# Patient Record
Sex: Female | Born: 1985 | Race: Black or African American | Hispanic: No | Marital: Single | Smoking: Never smoker
Health system: Southern US, Community
[De-identification: ages and names within clinical notes are randomized; demographics above are authoritative.]

## PROBLEM LIST (undated history)

## (undated) ENCOUNTER — Inpatient Hospital Stay (HOSPITAL_COMMUNITY): Payer: Self-pay

## (undated) DIAGNOSIS — Z8619 Personal history of other infectious and parasitic diseases: Secondary | ICD-10-CM

## (undated) DIAGNOSIS — O10919 Unspecified pre-existing hypertension complicating pregnancy, unspecified trimester: Secondary | ICD-10-CM

## (undated) DIAGNOSIS — E669 Obesity, unspecified: Secondary | ICD-10-CM

## (undated) DIAGNOSIS — I10 Essential (primary) hypertension: Secondary | ICD-10-CM

## (undated) DIAGNOSIS — Z36 Encounter for antenatal screening of mother: Secondary | ICD-10-CM

## (undated) DIAGNOSIS — M419 Scoliosis, unspecified: Secondary | ICD-10-CM

## (undated) HISTORY — PX: WISDOM TOOTH EXTRACTION: SHX21

## (undated) HISTORY — DX: Obesity, unspecified: E66.9

## (undated) HISTORY — PX: SPINAL GROWTH RODS: SHX2427

## (undated) HISTORY — DX: Essential (primary) hypertension: I10

## (undated) HISTORY — DX: Encounter for antenatal screening of mother: Z36

## (undated) HISTORY — DX: Personal history of other infectious and parasitic diseases: Z86.19

---

## 2007-04-12 ENCOUNTER — Emergency Department (HOSPITAL_COMMUNITY): Admission: EM | Admit: 2007-04-12 | Discharge: 2007-04-12 | Payer: Self-pay | Admitting: Emergency Medicine

## 2007-10-28 ENCOUNTER — Other Ambulatory Visit: Admission: RE | Admit: 2007-10-28 | Discharge: 2007-10-28 | Payer: Self-pay | Admitting: Family Medicine
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
## 2009-05-22 ENCOUNTER — Other Ambulatory Visit: Admission: RE | Admit: 2009-05-22 | Discharge: 2009-05-22 | Payer: Self-pay | Admitting: *Deleted

## 2009-08-01 ENCOUNTER — Inpatient Hospital Stay (HOSPITAL_COMMUNITY): Admission: AD | Admit: 2009-08-01 | Discharge: 2009-08-01 | Payer: Self-pay | Admitting: Family Medicine

## 2009-08-03 ENCOUNTER — Inpatient Hospital Stay (HOSPITAL_COMMUNITY): Admission: AD | Admit: 2009-08-03 | Discharge: 2009-08-03 | Payer: Self-pay | Admitting: Obstetrics and Gynecology

## 2010-01-19 ENCOUNTER — Emergency Department (HOSPITAL_COMMUNITY)
Admission: EM | Admit: 2010-01-19 | Discharge: 2010-01-19 | Payer: Self-pay | Source: Home / Self Care | Admitting: Emergency Medicine

## 2010-06-25 ENCOUNTER — Other Ambulatory Visit
Admission: RE | Admit: 2010-06-25 | Discharge: 2010-06-25 | Payer: Self-pay | Source: Home / Self Care | Admitting: Internal Medicine

## 2010-06-25 ENCOUNTER — Other Ambulatory Visit: Payer: Self-pay | Admitting: Internal Medicine

## 2010-08-29 LAB — URINALYSIS, ROUTINE W REFLEX MICROSCOPIC
Bilirubin Urine: NEGATIVE
Glucose, UA: NEGATIVE mg/dL
Ketones, ur: NEGATIVE mg/dL
Leukocytes, UA: NEGATIVE
Nitrite: NEGATIVE
Protein, ur: NEGATIVE mg/dL
Specific Gravity, Urine: 1.02 (ref 1.005–1.030)
Urobilinogen, UA: 0.2 mg/dL (ref 0.0–1.0)
pH: 7 (ref 5.0–8.0)

## 2010-08-29 LAB — WET PREP, GENITAL: Trich, Wet Prep: NONE SEEN

## 2010-08-29 LAB — POCT PREGNANCY, URINE: Preg Test, Ur: NEGATIVE

## 2010-08-29 LAB — CBC
HCT: 36.2 % (ref 36.0–46.0)
Hemoglobin: 11.7 g/dL — ABNORMAL LOW (ref 12.0–15.0)
Hemoglobin: 11.8 g/dL — ABNORMAL LOW (ref 12.0–15.0)
MCV: 88 fL (ref 78.0–100.0)
MCV: 88.7 fL (ref 78.0–100.0)
RBC: 4.13 MIL/uL (ref 3.87–5.11)
WBC: 10.2 10*3/uL (ref 4.0–10.5)
WBC: 7.2 10*3/uL (ref 4.0–10.5)

## 2010-08-29 LAB — URINE MICROSCOPIC-ADD ON

## 2011-06-06 LAB — OB RESULTS CONSOLE RUBELLA ANTIBODY, IGM: Rubella: IMMUNE

## 2011-06-06 LAB — OB RESULTS CONSOLE GC/CHLAMYDIA
Chlamydia: NEGATIVE
Gonorrhea: NEGATIVE

## 2011-06-06 LAB — OB RESULTS CONSOLE HEPATITIS B SURFACE ANTIGEN: Hepatitis B Surface Ag: NEGATIVE

## 2011-06-06 LAB — OB RESULTS CONSOLE RPR: RPR: NONREACTIVE

## 2011-06-09 NOTE — L&D Delivery Note (Signed)
Delivery Note At 2:52 AM [name] viable and healthy female was delivered via Vaginal, Spontaneous Delivery (Presentation: ; Occiput Anterior).  APGAR: 7, 9; weight .   Placenta status: Intact, Spontaneous.  Not sent Cord: 3 vessels with the following complications: None.  Cord pH: none  Anesthesia: Epidural Local lidocaine Episiotomy: none Lacerations: 2nd degree;Sulcus;Vaginal;Perineal Suture Repair: 3.0 chromic Est. Blood Loss (mL): 300  Mom to postpartum.  Baby to nursery-stable.  Diamond [name] 12/23/2011, 3:17 AM

## 2011-08-31 IMAGING — US US TRANSVAGINAL NON-OB
1 series · 14 of 25 positions shown · non-contrast
Comparison: None.

CLINICAL DATA: Menorrhagia.

TRANSABDOMINAL AND TRANSVAGINAL ULTRASOUND OF PELVIS
TECHNIQUE: Both transabdominal and transvaginal ultrasound
examinations of the pelvis were performed including evaluation of
the uterus, ovaries, adnexal regions, and pelvic cul-de-sac.

[Series 1: us transvaginal non-ob · 0.20mm/px · 14 of 40 slices shown]
[im 1/40]
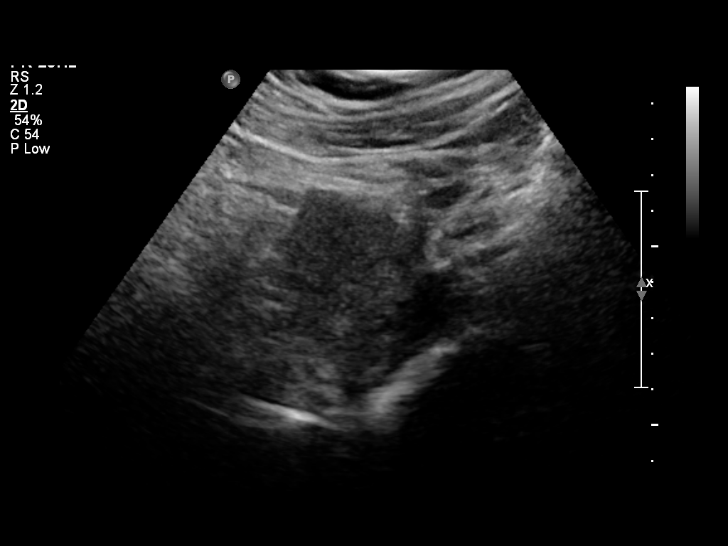
[im 4/40]
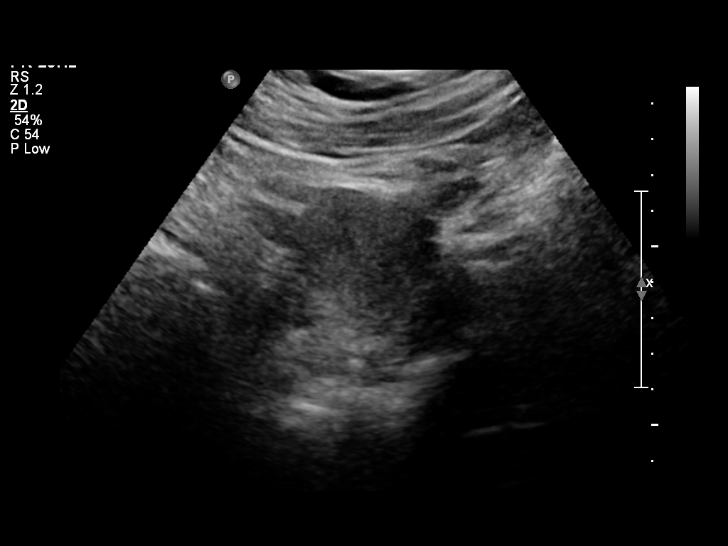
[im 7/40]
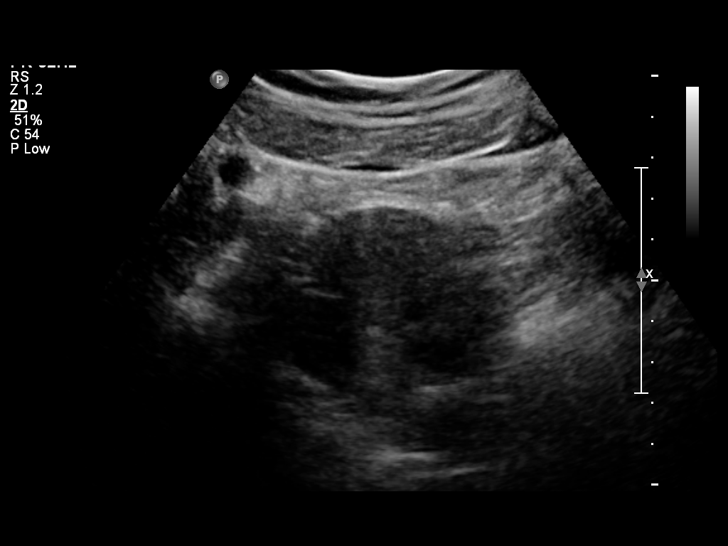
[im 10/40]
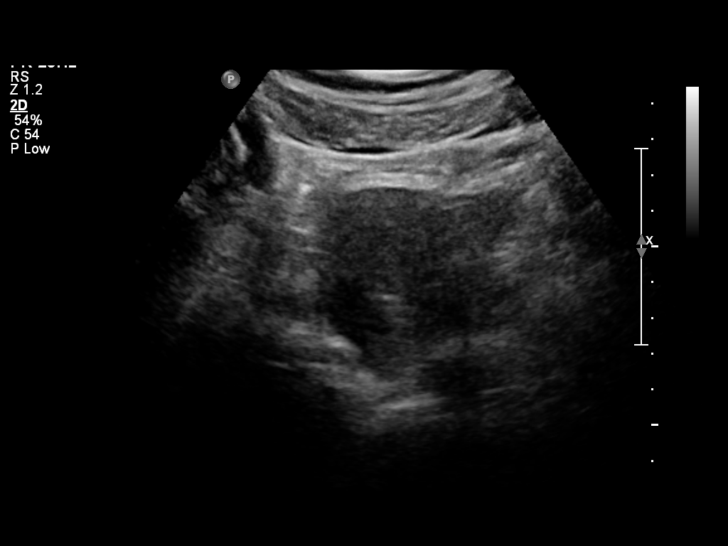
[im 14/40]
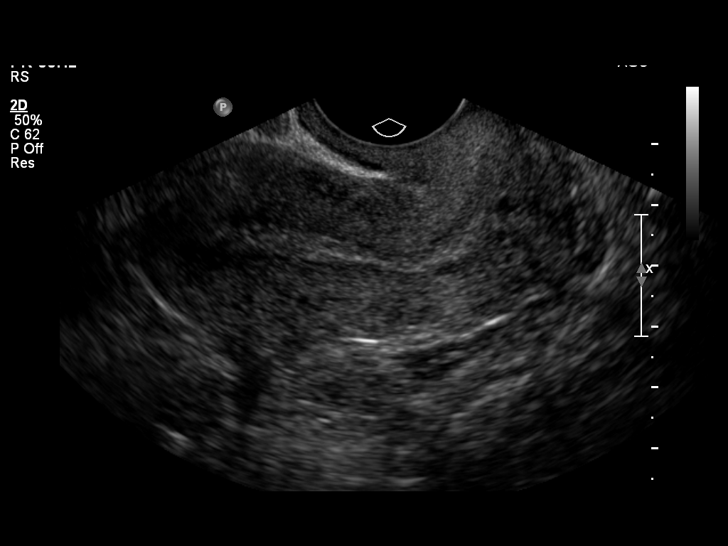
[im 15/40]
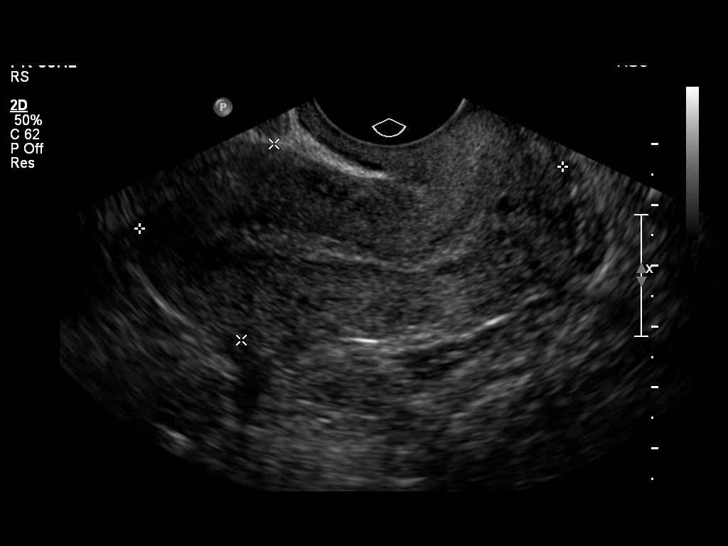
[im 18/40]
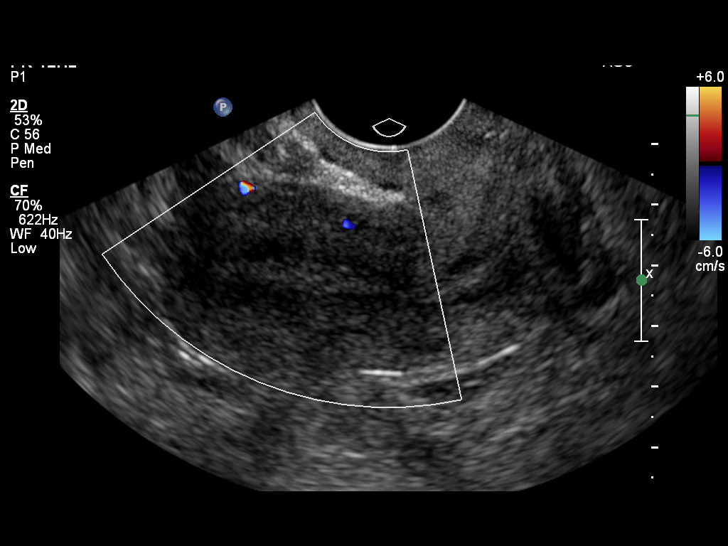
[im 22/40]
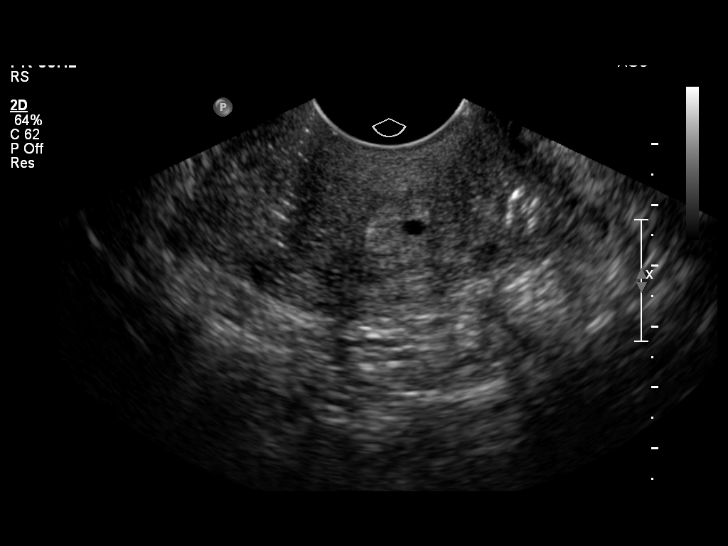
[im 25/40]
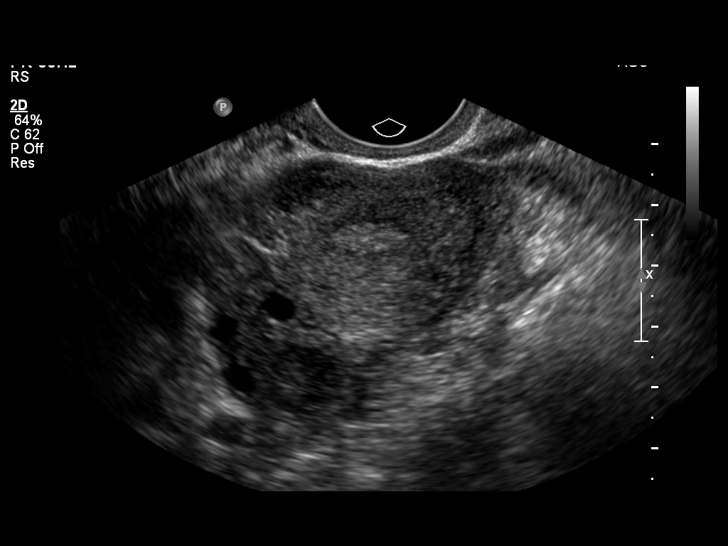
[im 27/40]
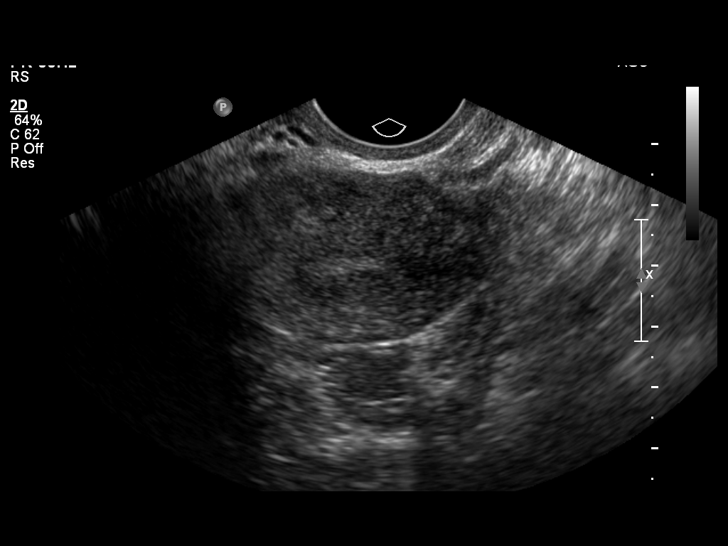
[im 30/40]
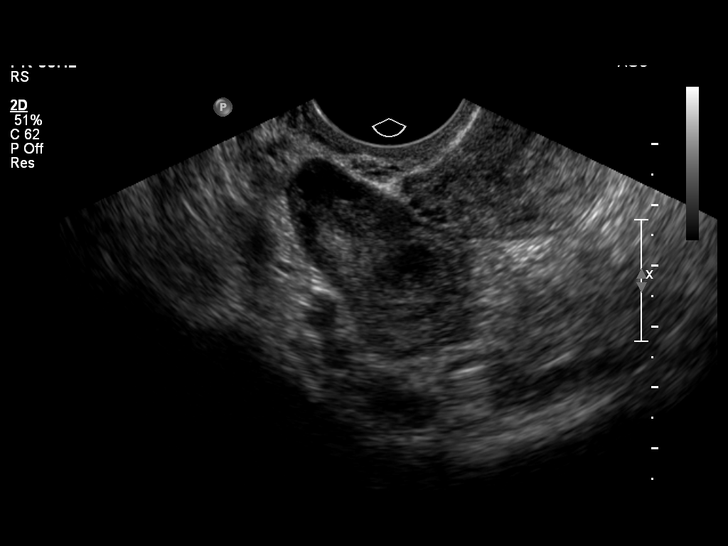
[im 33/40]
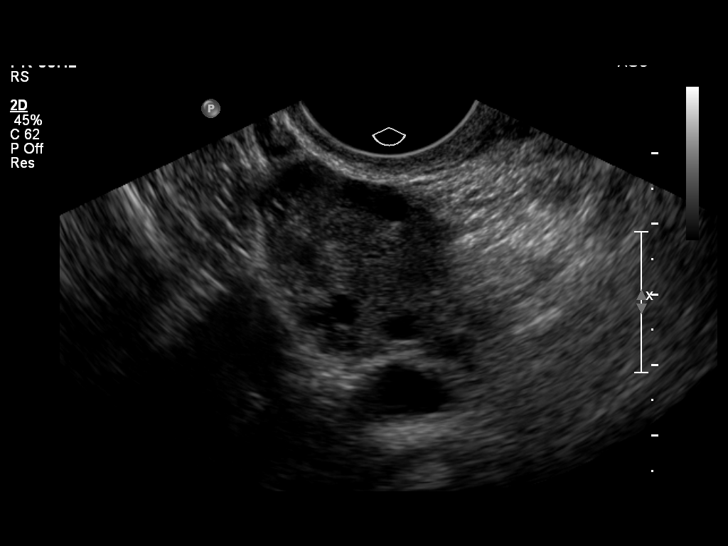
[im 36/40]
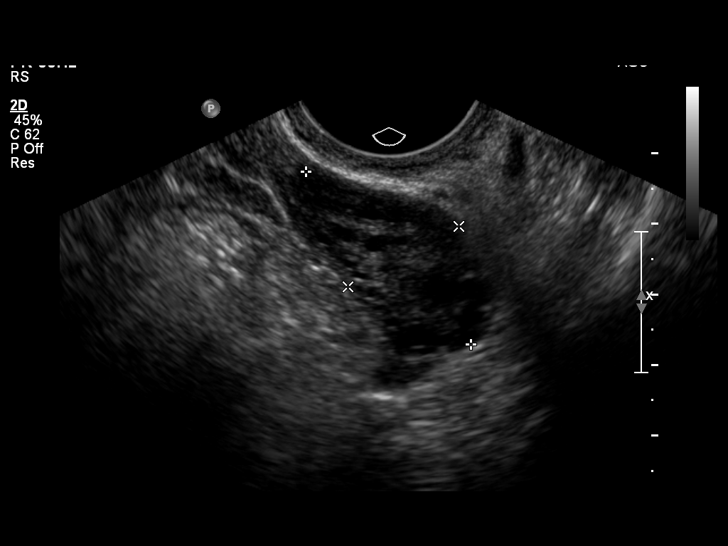
[im 40/40]
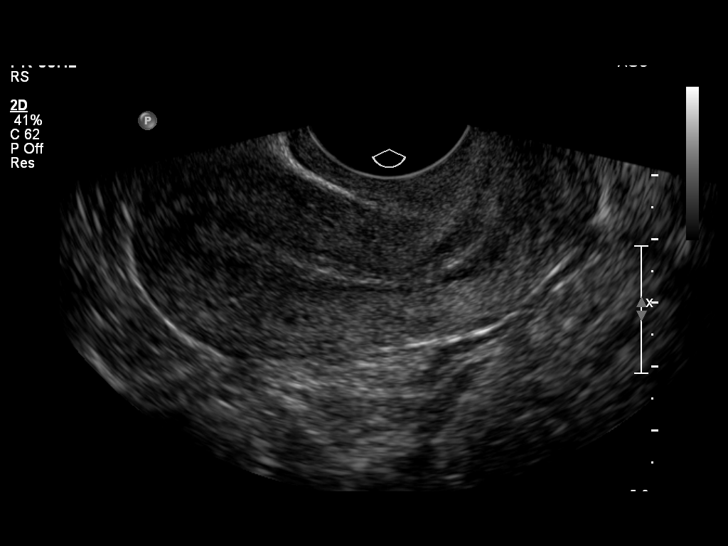

[14 of 25 positions shown; findings below may reference images not displayed]

FINDINGS: Uterus measures approximately 7.0 x 3.3 x 4.7 cm.  No evidence of
uterine mass.

Endometrium is homogeneous and measures approximately 5 mm in
thickness.

Right Ovary measures 3.7 x 2.1 x 2.5 cm.  Normal sonographic
appearance of the ovary.

Left Ovary measures 3.4 x 1.8 x 2.3 cm.  Normal sonographic
appearance.

Other Findings:  No free fluid identified in the pelvis.
IMPRESSION: Normal pelvic ultrasound.

## 2011-09-10 ENCOUNTER — Encounter (HOSPITAL_COMMUNITY): Payer: Self-pay | Admitting: *Deleted

## 2011-09-10 ENCOUNTER — Inpatient Hospital Stay (HOSPITAL_COMMUNITY)
Admission: AD | Admit: 2011-09-10 | Discharge: 2011-09-10 | Disposition: A | Discharge status: Home / Self Care | Payer: 59 | Attending: Obstetrics and Gynecology | Admitting: Obstetrics and Gynecology

## 2011-09-10 DIAGNOSIS — R197 Diarrhea, unspecified: Secondary | ICD-10-CM

## 2011-09-10 DIAGNOSIS — O2390 Unspecified genitourinary tract infection in pregnancy, unspecified trimester: Secondary | ICD-10-CM | POA: Insufficient documentation

## 2011-09-10 DIAGNOSIS — B3731 Acute candidiasis of vulva and vagina: Secondary | ICD-10-CM | POA: Insufficient documentation

## 2011-09-10 DIAGNOSIS — B373 Candidiasis of vulva and vagina: Secondary | ICD-10-CM | POA: Insufficient documentation

## 2011-09-10 HISTORY — DX: Scoliosis, unspecified: M41.9

## 2011-09-10 LAB — URINALYSIS, ROUTINE W REFLEX MICROSCOPIC
Bilirubin Urine: NEGATIVE
Hgb urine dipstick: NEGATIVE
Specific Gravity, Urine: 1.025 (ref 1.005–1.030)
pH: 6 (ref 5.0–8.0)

## 2011-09-10 NOTE — MAU Note (Signed)
Pt states 1 instance of diarrhea since 0700, had h/a, no abd pain or diarrhea.

## 2011-09-10 NOTE — MAU Note (Signed)
Pt reports abd pain x 3 hours and diarrhea x 2. Denies bleeding or ROM

## 2011-09-10 NOTE — MAU Provider Note (Signed)
History   26 yo G1P0 BF now @ [redacted] wk gestation presents for evaluation of diarrhea x 2 since 5: 30 am. Pt had abd cramping. No fever. (+) FM no other family member ill Had eaten taco. Pt c/o itchy rash b/w breast  Chief Complaint  Patient presents with  . Abdominal Pain  diarrhea   OB History    Grav Para Term Preterm Abortions TAB SAB Ect Mult Living   1               Past Medical History  Diagnosis Date  . Scoliosis     Past Surgical History  Procedure Date  . Spinal growth rods     Family History  Problem Relation Age of Onset  . Hypertension Mother     History  Substance Use Topics  . Smoking status: Never Smoker   . Smokeless tobacco: Not on file  . Alcohol Use: No    Allergies: No Known Allergies  Prescriptions prior to admission  Medication Sig Dispense Refill  . acetaminophen (TYLENOL) 500 MG tablet Take 500 mg by mouth every 6 (six) hours as needed. For headache      . OVER THE COUNTER MEDICATION Take 1 tablet by mouth daily as needed. Airborne effervescent tablet- dissolve in water and drink as needed for cold symptoms      . Prenatal Vit-Fe Fumarate-FA (PRENATAL MULTIVITAMIN) TABS Take 1 tablet by mouth daily.         Physical Exam   Blood pressure 138/76, pulse 86, temperature 98.4 F (36.9 C), temperature source Oral, resp. rate 20, height 5\' 8"  (1.727 m), weight 311 lb (141.069 kg).  General appearance: alert, cooperative and no distress Lungs: clear to auscultation bilaterally Heart: regular rate and rhythm, S1, S2 normal, no murmur, click, rub or gallop Abdomen: gravid soft nontender Pelvic: l/c/oop Extremities: no edema, redness or tenderness in the calves or thighs ED Course  Diarrhea ? Poss GI flu IUP @ 24 week  3) candida infection P) d/c home, keep OB appt in am. mycolog ointment MDM  Tarrance Januszewski A, MD 8:15 AM 09/10/2011

## 2011-09-10 NOTE — Discharge Instructions (Signed)
Diarrhea Infections caused by germs (bacterial) or a virus commonly cause diarrhea. Your caregiver has determined that with time, rest and fluids, the diarrhea should improve. In general, eat normally while drinking more water than usual. Although water may prevent dehydration, it does not contain salt and minerals (electrolytes). Broths, weak tea without caffeine and oral rehydration solutions (ORS) replace fluids and electrolytes. Small amounts of fluids should be taken frequently. Large amounts at one time may not be tolerated. Plain water may be harmful in infants and the elderly. Oral rehydrating solutions (ORS) are available at pharmacies and grocery stores. ORS replace water and important electrolytes in proper proportions. Sports drinks are not as effective as ORS and may be harmful due to sugars worsening diarrhea.  ORS is especially recommended for use in children with diarrhea. As a general guideline for children, replace any new fluid losses from diarrhea and/or vomiting with ORS as follows:   If your child weighs 22 pounds or under (10 kg or less), give 60-120 mL ( -  cup or 2 - 4 ounces) of ORS for each episode of diarrheal stool or vomiting episode.   If your child weighs more than 22 pounds (more than 10 kgs), give 120-240 mL ( - 1 cup or 4 - 8 ounces) of ORS for each diarrheal stool or episode of vomiting.   While correcting for dehydration, children should eat normally. However, foods high in sugar should be avoided because this may worsen diarrhea. Large amounts of carbonated soft drinks, juice, gelatin desserts and other highly sugared drinks should be avoided.   After correction of dehydration, other liquids that are appealing to the child may be added. Children should drink small amounts of fluids frequently and fluids should be increased as tolerated. Children should drink enough fluids to keep urine clear or pale yellow.   Adults should eat normally while drinking more fluids  than usual. Drink small amounts of fluids frequently and increase as tolerated. Drink enough fluids to keep urine clear or pale yellow. Broths, weak decaffeinated tea, lemon lime soft drinks (allowed to go flat) and ORS replace fluids and electrolytes.   Avoid:   Carbonated drinks.   Juice.   Extremely hot or cold fluids.   Caffeine drinks.   Fatty, greasy foods.   Alcohol.   Tobacco.   Too much intake of anything at one time.   Gelatin desserts.   Probiotics are active cultures of beneficial bacteria. They may lessen the amount and number of diarrheal stools in adults. Probiotics can be found in yogurt with active cultures and in supplements.   Wash hands well to avoid spreading bacteria and virus.   Anti-diarrheal medications are not recommended for infants and children.   Only take over-the-counter or prescription medicines for pain, discomfort or fever as directed by your caregiver. Do not give aspirin to children because it may cause Reye's Syndrome.   For adults, ask your caregiver if you should continue all prescribed and over-the-counter medicines.   If your caregiver has given you a follow-up appointment, it is very important to keep that appointment. Not keeping the appointment could result in a chronic or permanent injury, and disability. If there is any problem keeping the appointment, you must call back to this facility for assistance.  SEEK IMMEDIATE MEDICAL CARE IF:   You or your child is unable to keep fluids down or other symptoms or problems become worse in spite of treatment.   Vomiting or diarrhea develops and becomes persistent.     There is vomiting of blood or bile (green material).   There is blood in the stool or the stools are black and tarry.   There is no urine output in 6-8 hours or there is only a small amount of very dark urine.   Abdominal pain develops, increases or localizes.   You have a fever.   Your baby is older than 3 months with a  rectal temperature of 102 F (38.9 C) or higher.   Your baby is 66 months old or younger with a rectal temperature of 100.4 F (38 C) or higher.   You or your child develops excessive weakness, dizziness, fainting or extreme thirst.   You or your child develops a rash, stiff neck, severe headache or become irritable or sleepy and difficult to awaken.  MAKE SURE YOU:   Understand these instructions.   Will watch your condition.   Will get help right away if you are not doing well or get worse.  Document Released: 05/15/2002 Document Revised: 05/14/2011 Document Reviewed: 04/01/2009 [facility] Patient Information 2012 [place], [place].Diarrhea Infections caused by germs (bacterial) or a virus commonly cause diarrhea. Your caregiver has determined that with time, rest and fluids, the diarrhea should improve. In general, eat normally while drinking more water than usual. Although water may prevent dehydration, it does not contain salt and minerals (electrolytes). Broths, weak tea without caffeine and oral rehydration solutions (ORS) replace fluids and electrolytes. Small amounts of fluids should be taken frequently. Large amounts at one time may not be tolerated. Plain water may be harmful in infants and the elderly. Oral rehydrating solutions (ORS) are available at pharmacies and grocery stores. ORS replace water and important electrolytes in proper proportions. Sports drinks are not as effective as ORS and may be harmful due to sugars worsening diarrhea.  ORS is especially recommended for use in children with diarrhea. As a general guideline for children, replace any new fluid losses from diarrhea and/or vomiting with ORS as follows:   If your child weighs 22 pounds or under (10 kg or less), give 60-120 mL ( -  cup or 2 - 4 ounces) of ORS for each episode of diarrheal stool or vomiting episode.   If your child weighs more than 22 pounds (more than 10 kgs), give 120-240 mL ( - 1 cup or 4 - 8  ounces) of ORS for each diarrheal stool or episode of vomiting.   While correcting for dehydration, children should eat normally. However, foods high in sugar should be avoided because this may worsen diarrhea. Large amounts of carbonated soft drinks, juice, gelatin desserts and other highly sugared drinks should be avoided.   After correction of dehydration, other liquids that are appealing to the child may be added. Children should drink small amounts of fluids frequently and fluids should be increased as tolerated. Children should drink enough fluids to keep urine clear or pale yellow.   Adults should eat normally while drinking more fluids than usual. Drink small amounts of fluids frequently and increase as tolerated. Drink enough fluids to keep urine clear or pale yellow. Broths, weak decaffeinated tea, lemon lime soft drinks (allowed to go flat) and ORS replace fluids and electrolytes.   Avoid:   Carbonated drinks.   Juice.   Extremely hot or cold fluids.   Caffeine drinks.   Fatty, greasy foods.   Alcohol.   Tobacco.   Too much intake of anything at one time.   Gelatin desserts.   Probiotics are active cultures  of beneficial bacteria. They may lessen the amount and number of diarrheal stools in adults. Probiotics can be found in yogurt with active cultures and in supplements.   Wash hands well to avoid spreading bacteria and virus.   Anti-diarrheal medications are not recommended for infants and children.   Only take over-the-counter or prescription medicines for pain, discomfort or fever as directed by your caregiver. Do not give aspirin to children because it may cause Reye's Syndrome.   For adults, ask your caregiver if you should continue all prescribed and over-the-counter medicines.   If your caregiver has given you a follow-up appointment, it is very important to keep that appointment. Not keeping the appointment could result in a chronic or permanent injury, and  disability. If there is any problem keeping the appointment, you must call back to this facility for assistance.  SEEK IMMEDIATE MEDICAL CARE IF:   You or your child is unable to keep fluids down or other symptoms or problems become worse in spite of treatment.   Vomiting or diarrhea develops and becomes persistent.   There is vomiting of blood or bile (green material).   There is blood in the stool or the stools are black and tarry.   There is no urine output in 6-8 hours or there is only a small amount of very dark urine.   Abdominal pain develops, increases or localizes.   You have a fever.   Your baby is older than 3 months with a rectal temperature of 102 F (38.9 C) or higher.   Your baby is 54 months old or younger with a rectal temperature of 100.4 F (38 C) or higher.   You or your child develops excessive weakness, dizziness, fainting or extreme thirst.   You or your child develops a rash, stiff neck, severe headache or become irritable or sleepy and difficult to awaken.  MAKE SURE YOU:   Understand these instructions.   Will watch your condition.   Will get help right away if you are not doing well or get worse.  Document Released: 05/15/2002 Document Revised: 05/14/2011 Document Reviewed: 04/01/2009 [facility] Patient Information 2012 [place], [place].Diet for Diarrhea, Adult Having frequent, runny stools (diarrhea) has many causes. Diarrhea may be caused or worsened by food or drink. Diarrhea may be relieved by changing your diet. IF YOU ARE NOT TOLERATING SOLID FOODS:  Drink enough water and fluids to keep your urine clear or pale yellow.   Avoid sugary drinks and sodas as well as milk-based beverages.   Avoid beverages containing caffeine and alcohol.   You may try rehydrating beverages. You can make your own by following this recipe:    tsp table salt.    tsp baking soda.   ? tsp salt substitute (potassium chloride).   1 tbs + 1 tsp sugar.   1 qt  water.  As your stools become more solid, you can start eating solid foods. Add foods one at a time. If a certain food causes your diarrhea to get worse, avoid that food and try other foods. A low fiber, low-fat, and lactose-free diet is recommended. Small, frequent meals may be better tolerated.  Starches  Allowed:  White, [place], and pita breads, plain rolls, buns, bagels. Plain muffins, matzo. Soda, saltine, or graham crackers. Pretzels, melba toast, zwieback. Cooked cereals made with water: cornmeal, farina, cream cereals. Dry cereals: refined corn, wheat, rice. Potatoes prepared any way without skins, refined macaroni, spaghetti, noodles, refined rice.   Avoid:  Bread, rolls,  or crackers made with whole wheat, multi-grains, rye, bran seeds, nuts, or coconut. Corn tortillas or taco shells. Cereals containing whole grains, multi-grains, bran, coconut, nuts, or raisins. Cooked or dry oatmeal. Coarse wheat cereals, granola. Cereals advertised as "high-fiber." Potato skins. Whole grain pasta, wild or brown rice. Popcorn. Sweet potatoes/yams. Sweet rolls, doughnuts, waffles, pancakes, sweet breads.  Vegetables  Allowed: Strained tomato and vegetable juices. Most well-cooked and canned vegetables without seeds. Fresh: Tender lettuce, cucumber without the skin, cabbage, spinach, bean sprouts.   Avoid: Fresh, cooked, or canned: Artichokes, baked beans, beet greens, broccoli, Brussels sprouts, corn, kale, legumes, peas, sweet potatoes. Cooked: Green or red cabbage, spinach. Avoid large servings of any vegetables, because vegetables shrink when cooked, and they contain more fiber per serving than fresh vegetables.  Fruit  Allowed: All fruit juices except prune juice. Cooked or canned: Apricots, applesauce, cantaloupe, cherries, fruit cocktail, grapefruit, grapes, kiwi, mandarin oranges, peaches, pears, plums, watermelon. Fresh: Apples without skin, ripe banana, grapes, cantaloupe, cherries, grapefruit,  peaches, oranges, plums. Keep servings limited to  cup or 1 piece.   Avoid: Fresh: Apple with skin, apricots, mango, pears, raspberries, strawberries. Prune juice, stewed or dried prunes. Dried fruits, raisins, dates. Large servings of all fresh fruits.  Meat and Meat Substitutes  Allowed: Ground or well-cooked tender beef, ham, veal, lamb, pork, or poultry. Eggs, plain cheese. Fish, oysters, shrimp, lobster, other seafoods. Liver, organ meats.   Avoid: Tough, fibrous meats with gristle. Peanut butter, smooth or chunky. Cheese, nuts, seeds, legumes, dried peas, beans, lentils.  Milk  Allowed: Yogurt, lactose-free milk, kefir, drinkable yogurt, buttermilk, soy milk.   Avoid: Milk, chocolate milk, beverages made with milk, such as milk shakes.  Soups  Allowed: Bouillon, broth, or soups made from allowed foods. Any strained soup.   Avoid: Soups made from vegetables that are not allowed, cream or milk-based soups.  Desserts and Sweets  Allowed: Sugar-free gelatin, sugar-free frozen ice pops made without sugar alcohol.   Avoid: Plain cakes and cookies, pie made with allowed fruit, pudding, custard, cream pie. Gelatin, fruit, ice, sherbet, frozen ice pops. Ice cream, ice milk without nuts. Plain hard candy, honey, jelly, molasses, syrup, sugar, chocolate syrup, gumdrops, marshmallows.  Fats and Oils  Allowed: Avoid any fats and oils.   Avoid: Seeds, nuts, olives, avocados. Margarine, butter, cream, mayonnaise, salad oils, plain salad dressings made from allowed foods. Plain gravy, crisp bacon without rind.  Beverages  Allowed: Water, decaffeinated teas, oral rehydration solutions, sugar-free beverages.   Avoid: Fruit juices, caffeinated beverages (coffee, tea, soda or pop), alcohol, sports drinks, or lemon-lime soda or pop.  Condiments  Allowed: Ketchup, mustard, horseradish, vinegar, cream sauce, cheese sauce, cocoa powder. Spices in moderation: allspice, basil, bay leaves, celery  powder or leaves, cinnamon, cumin powder, curry powder, ginger, mace, marjoram, onion or garlic powder, oregano, paprika, parsley flakes, ground pepper, rosemary, sage, savory, tarragon, thyme, turmeric.   Avoid: Coconut, honey.  Weight Monitoring: Weigh yourself every day. You should weigh yourself in the morning after you urinate and before you eat breakfast. Wear the same amount of clothing when you weigh yourself. Record your weight daily. Bring your recorded weights to your clinic visits. Tell your caregiver right away if you have gained 3 lb/1.4 kg or more in 1 day, 5 lb/2.3 kg in a week, or whatever amount you were told to report. SEEK IMMEDIATE MEDICAL CARE IF:   You are unable to keep fluids down.   You start to throw up (vomit) or  diarrhea keeps coming back (persistent).   Abdominal pain develops, increases, or can be felt in one place (localizes).   You have an oral temperature above 102 F (38.9 C), not controlled by medicine.   Diarrhea contains blood or mucus.   You develop excessive weakness, dizziness, fainting, or extreme thirst.  MAKE SURE YOU:   Understand these instructions.   Will watch your condition.   Will get help right away if you are not doing well or get worse.  Document Released: 08/15/2003 Document Revised: 05/14/2011 Document Reviewed: 12/06/2008 [facility] Patient Information 2012 [place], [place].

## 2011-12-17 ENCOUNTER — Other Ambulatory Visit: Payer: Self-pay | Admitting: Obstetrics and Gynecology

## 2011-12-21 ENCOUNTER — Encounter (HOSPITAL_COMMUNITY): Payer: Self-pay | Admitting: *Deleted

## 2011-12-21 ENCOUNTER — Telehealth (HOSPITAL_COMMUNITY): Payer: Self-pay | Admitting: *Deleted

## 2011-12-21 NOTE — Telephone Encounter (Signed)
Preadmission screen  

## 2011-12-22 ENCOUNTER — Inpatient Hospital Stay (HOSPITAL_COMMUNITY): Payer: 59 | Admitting: Anesthesiology

## 2011-12-22 ENCOUNTER — Encounter (HOSPITAL_COMMUNITY): Payer: Self-pay

## 2011-12-22 ENCOUNTER — Encounter (HOSPITAL_COMMUNITY): Payer: Self-pay | Admitting: Anesthesiology

## 2011-12-22 ENCOUNTER — Inpatient Hospital Stay (HOSPITAL_COMMUNITY)
Admission: RE | Admit: 2011-12-22 | Discharge: 2011-12-25 | DRG: 774 | Disposition: A | Discharge status: Home / Self Care | Payer: 59 | Source: Ambulatory Visit | Attending: Obstetrics and Gynecology | Admitting: Obstetrics and Gynecology

## 2011-12-22 DIAGNOSIS — O10919 Unspecified pre-existing hypertension complicating pregnancy, unspecified trimester: Secondary | ICD-10-CM

## 2011-12-22 DIAGNOSIS — O1002 Pre-existing essential hypertension complicating childbirth: Principal | ICD-10-CM | POA: Diagnosis present

## 2011-12-22 DIAGNOSIS — Z22330 Carrier of Group B streptococcus: Secondary | ICD-10-CM

## 2011-12-22 DIAGNOSIS — O99892 Other specified diseases and conditions complicating childbirth: Secondary | ICD-10-CM | POA: Diagnosis present

## 2011-12-22 HISTORY — DX: Unspecified pre-existing hypertension complicating pregnancy, unspecified trimester: O10.919

## 2011-12-22 LAB — CBC
Hemoglobin: 11.3 g/dL — ABNORMAL LOW (ref 12.0–15.0)
MCH: 27.7 pg (ref 26.0–34.0)
Platelets: 178 10*3/uL (ref 150–400)
RBC: 4.08 MIL/uL (ref 3.87–5.11)

## 2011-12-22 LAB — RPR: RPR Ser Ql: NONREACTIVE

## 2011-12-22 MED ORDER — IBUPROFEN 600 MG PO TABS
600.0000 mg | ORAL_TABLET | Freq: Four times a day (QID) | ORAL | Status: DC | PRN
  Administered 2011-12-23: 600 mg via ORAL
  Filled 2011-12-22: qty 1

## 2011-12-22 MED ORDER — LACTATED RINGERS IV SOLN: 500.0000 mL | INTRAVENOUS | Status: DC | PRN

## 2011-12-22 MED ORDER — LACTATED RINGERS IV SOLN: 500.0000 mL | Freq: Once | INTRAVENOUS | Status: DC

## 2011-12-22 MED ORDER — NALBUPHINE SYRINGE 5 MG/0.5 ML
10.0000 mg | INJECTION | INTRAMUSCULAR | Status: DC | PRN
  Administered 2011-12-23: 10 mg via INTRAVENOUS
  Filled 2011-12-22 (×2): qty 1

## 2011-12-22 MED ORDER — LACTATED RINGERS IV SOLN
INTRAVENOUS | Status: DC
  Administered 2011-12-22 (×2): via INTRAVENOUS

## 2011-12-22 MED ORDER — EPHEDRINE 5 MG/ML INJ
10.0000 mg | INTRAVENOUS | Status: DC | PRN
Start: 2011-12-22 — End: 2011-12-23

## 2011-12-22 MED ORDER — OXYTOCIN BOLUS FROM INFUSION
250.0000 mL | Freq: Once | INTRAVENOUS | Status: DC
  Filled 2011-12-22: qty 500

## 2011-12-22 MED ORDER — NALBUPHINE SYRINGE 5 MG/0.5 ML
INJECTION | INTRAMUSCULAR | Status: AC
  Filled 2011-12-22: qty 1

## 2011-12-22 MED ORDER — FENTANYL 2.5 MCG/ML BUPIVACAINE 1/10 % EPIDURAL INFUSION (WH - ANES)
14.0000 mL/h | INTRAMUSCULAR | Status: DC
  Administered 2011-12-22 – 2011-12-23 (×2): 14 mL/h via EPIDURAL
  Filled 2011-12-22 (×3): qty 60

## 2011-12-22 MED ORDER — DIPHENHYDRAMINE HCL 50 MG/ML IJ SOLN: 12.5000 mg | INTRAMUSCULAR | Status: DC | PRN

## 2011-12-22 MED ORDER — OXYCODONE-ACETAMINOPHEN 5-325 MG PO TABS
1.0000 | ORAL_TABLET | ORAL | Status: DC | PRN
  Administered 2011-12-23: 2 via ORAL
  Filled 2011-12-22: qty 2

## 2011-12-22 MED ORDER — PENICILLIN G POTASSIUM 5000000 UNITS IJ SOLR
5.0000 10*6.[IU] | Freq: Once | INTRAVENOUS | Status: AC
  Administered 2011-12-22: 5 10*6.[IU] via INTRAVENOUS
  Filled 2011-12-22: qty 5

## 2011-12-22 MED ORDER — NALBUPHINE HCL 10 MG/ML IJ SOLN
10.0000 mg | INTRAMUSCULAR | Status: DC | PRN
  Administered 2011-12-22 (×2): 10 mg via INTRAVENOUS
  Filled 2011-12-22 (×2): qty 1

## 2011-12-22 MED ORDER — OXYTOCIN 40 UNITS IN LACTATED RINGERS INFUSION - SIMPLE MED: 62.5000 mL/h | Freq: Once | INTRAVENOUS | Status: DC

## 2011-12-22 MED ORDER — EPHEDRINE 5 MG/ML INJ
10.0000 mg | INTRAVENOUS | Status: DC | PRN
  Filled 2011-12-22: qty 4

## 2011-12-22 MED ORDER — CITRIC ACID-SODIUM CITRATE 334-500 MG/5ML PO SOLN: 30.0000 mL | ORAL | Status: DC | PRN

## 2011-12-22 MED ORDER — LIDOCAINE HCL (PF) 1 % IJ SOLN
30.0000 mL | INTRAMUSCULAR | Status: DC | PRN
  Filled 2011-12-22: qty 30

## 2011-12-22 MED ORDER — ACETAMINOPHEN 325 MG PO TABS: 650.0000 mg | ORAL_TABLET | ORAL | Status: DC | PRN

## 2011-12-22 MED ORDER — LIDOCAINE HCL (PF) 1 % IJ SOLN
INTRAMUSCULAR | Status: DC | PRN
  Administered 2011-12-22 (×4): 8 mL
  Administered 2011-12-23: 30 mL

## 2011-12-22 MED ORDER — PHENYLEPHRINE 40 MCG/ML (10ML) SYRINGE FOR IV PUSH (FOR BLOOD PRESSURE SUPPORT)
80.0000 ug | PREFILLED_SYRINGE | INTRAVENOUS | Status: DC | PRN
  Filled 2011-12-22: qty 5

## 2011-12-22 MED ORDER — PENICILLIN G POTASSIUM 5000000 UNITS IJ SOLR
2.5000 10*6.[IU] | INTRAMUSCULAR | Status: DC
  Administered 2011-12-22 (×4): 2.5 10*6.[IU] via INTRAVENOUS
  Filled 2011-12-22 (×8): qty 2.5

## 2011-12-22 MED ORDER — TERBUTALINE SULFATE 1 MG/ML IJ SOLN: 0.2500 mg | Freq: Once | INTRAMUSCULAR | Status: AC | PRN

## 2011-12-22 MED ORDER — FLEET ENEMA 7-19 GM/118ML RE ENEM: 1.0000 | ENEMA | RECTAL | Status: DC | PRN

## 2011-12-22 MED ORDER — ONDANSETRON HCL 4 MG/2ML IJ SOLN: 4.0000 mg | Freq: Four times a day (QID) | INTRAMUSCULAR | Status: DC | PRN

## 2011-12-22 MED ORDER — PHENYLEPHRINE 40 MCG/ML (10ML) SYRINGE FOR IV PUSH (FOR BLOOD PRESSURE SUPPORT): 80.0000 ug | PREFILLED_SYRINGE | INTRAVENOUS | Status: DC | PRN

## 2011-12-22 MED ORDER — OXYTOCIN 40 UNITS IN LACTATED RINGERS INFUSION - SIMPLE MED
1.0000 m[IU]/min | INTRAVENOUS | Status: DC
  Administered 2011-12-22: 2 m[IU]/min via INTRAVENOUS
  Filled 2011-12-22: qty 1000

## 2011-12-22 NOTE — Progress Notes (Signed)
S: c/o pain w/ balloon insertion  O: VE 2/60-70%/-3 to left ant  Intracervical ballooon placed  Tracing: baseline 125  (+) accels   IMP: Latent phase Chronic HYN GBS cx (+)  On PCN  P) cont pitocin analgesic prn

## 2011-12-22 NOTE — H&P (Signed)
Diamond [name] is a 26 y.o. female presenting for induction 2nd to chronic hTn  History OB History    Grav Para Term Preterm Abortions TAB SAB Ect Mult Living   2 0   1 1    0     Past Medical History  Diagnosis Date  . Scoliosis   . Hypertension     no meds  . Obesity   . H/O chlamydia infection   . H/O varicella   . Other specified antenatal screening     AFP4   Past Surgical History  Procedure Date  . Spinal growth rods    Family History: family history includes Hypertension in her mother.  There is no history of Anesthesia problems. Social History:  reports that she has quit smoking. She has never used smokeless tobacco. She reports that she does not drink alcohol or use illicit drugs.   Prenatal Transfer Tool  Maternal Diabetes: No Genetic Screening: Normal Maternal Ultrasounds/Referrals: Normal Fetal Ultrasounds or other Referrals:  None Maternal Substance Abuse:  No Significant Maternal Medications:  None Significant Maternal Lab Results:  Lab values include: Group B Strep positive Other Comments:  None  ROS neg  Dilation: 2 Effacement (%): 80 Station: -2 Exam by:: lee Blood pressure 130/71, pulse 81, temperature 98 F (36.7 C), temperature source Oral, resp. rate 18, height 5\' 8"  (1.727 m), weight 141.522 kg (312 lb). Exam Physical Exam  Constitutional: She is oriented to person, place, and time. She appears well-developed and well-nourished.  HENT:  Head: Atraumatic.  Eyes: EOM are normal.  Neck: Neck supple.  Cardiovascular: Normal rate.   Respiratory: Effort normal and breath sounds normal.  GI: Soft. Bowel sounds are normal.  Neurological: She is alert and oriented to person, place, and time.  Skin: Skin is warm and dry.    Prenatal labs: ABO, Rh: O/Positive/-- (12/29 0000) Antibody: Negative (12/29 0000) Rubella: Immune (12/29 0000) RPR: Nonreactive (12/29 0000)  HBsAg: Negative (12/29 0000)  HIV: Non-reactive (11/16 0000)  GBS: Positive  (06/14 0000)   Assessment/Plan: Chronic HTn Term gestation GBS cx (+) P) admit routine labs. Pitocin. PCN IV  Vang Kraeger A 12/22/2011, 1:10 PM

## 2011-12-22 NOTE — Anesthesia Procedure Notes (Addendum)
Epidural Patient location during procedure: OB Start time: 12/22/2011 8:06 PM End time: 12/22/2011 8:10 PM Reason for block: procedure for pain  Staffing Anesthesiologist: Sandrea Hughs Performed by: anesthesiologist   Preanesthetic Checklist Completed: patient identified, site marked, surgical consent, pre-op evaluation, timeout performed, IV checked, risks and benefits discussed and monitors and equipment checked  Epidural Patient position: sitting Prep: site prepped and draped and DuraPrep Patient monitoring: continuous pulse ox and blood pressure Approach: midline Injection technique: LOR air  Needle:  Needle type: Tuohy  Needle gauge: 17 G Needle length: 9 cm Needle insertion depth: 9 cm Catheter type: closed end flexible Catheter size: 19 Gauge Catheter at skin depth: 15 cm Test dose: negative and Other  Assessment Events: blood not aspirated, injection not painful, no injection resistance, negative IV test and no paresthesia  Epidural Patient location during procedure: OB Start time: 12/22/2011 10:50 PM End time: 12/22/2011 11:00 PM Reason for block: procedure for pain  Staffing Anesthesiologist: Sandrea Hughs Performed by: anesthesiologist   Preanesthetic Checklist Completed: patient identified, site marked, surgical consent, pre-op evaluation, timeout performed, IV checked, risks and benefits discussed and monitors and equipment checked  Epidural Patient position: sitting Prep: site prepped and draped and DuraPrep Patient monitoring: continuous pulse ox and blood pressure Approach: midline Injection technique: LOR air  Needle:  Needle type: Tuohy  Needle gauge: 17 G Needle length: 9 cm Needle insertion depth: 5 cm cm Catheter type: closed end flexible Catheter size: 19 Gauge Catheter at skin depth: 10 cm Test dose: negative and Other  Assessment Events: blood not aspirated, injection not painful, no injection resistance,  negative IV test and no paresthesia

## 2011-12-22 NOTE — Progress Notes (Signed)
S:  Balloon fell out  O: Pitocin VE 4-5 /90 /-3  AROM clear AF. ISE, IUPC placed  Tracing: reactive baseline120 irreg ctx  IMP: Active phase Chronic HTN Term gestation. GBS cx (+) P)  Assess ctx. Cont pitocin. Epidural prn. Cont IV PCN

## 2011-12-22 NOTE — Anesthesia Preprocedure Evaluation (Signed)
Anesthesia Evaluation  Patient identified by MRN, date of birth, ID band Patient awake    Reviewed: Allergy & Precautions, H&P , NPO status , Patient's Chart, lab work & pertinent test results  Airway Mallampati: III TM Distance: >3 FB Neck ROM: full    Dental No notable dental hx.    Pulmonary neg pulmonary ROS,    Pulmonary exam normal       Cardiovascular     Neuro/Psych negative neurological ROS  negative psych ROS   GI/Hepatic negative GI ROS, Neg liver ROS,   Endo/Other  Morbid obesity  Renal/GU negative Renal ROS  negative genitourinary   Musculoskeletal negative musculoskeletal ROS (+)   Abdominal (+) + obese,   Peds negative pediatric ROS (+)  Hematology negative hematology ROS (+)   Anesthesia Other Findings   Reproductive/Obstetrics (+) Pregnancy                           Anesthesia Physical Anesthesia Plan  ASA: III  Anesthesia Plan: Epidural   Post-op Pain Management:    Induction:   Airway Management Planned:   Additional Equipment:   Intra-op Plan:   Post-operative Plan:   Informed Consent: I have reviewed the patients History and Physical, chart, labs and discussed the procedure including the risks, benefits and alternatives for the proposed anesthesia with the patient or authorized representative who has indicated his/her understanding and acceptance.     Plan Discussed with:   Anesthesia Plan Comments:         Anesthesia Quick Evaluation

## 2011-12-23 ENCOUNTER — Encounter (HOSPITAL_COMMUNITY): Payer: Self-pay

## 2011-12-23 DIAGNOSIS — O10919 Unspecified pre-existing hypertension complicating pregnancy, unspecified trimester: Secondary | ICD-10-CM

## 2011-12-23 HISTORY — DX: Unspecified pre-existing hypertension complicating pregnancy, unspecified trimester: O10.919

## 2011-12-23 MED ORDER — IBUPROFEN 600 MG PO TABS
600.0000 mg | ORAL_TABLET | Freq: Four times a day (QID) | ORAL | Status: DC
  Administered 2011-12-23 – 2011-12-25 (×7): 600 mg via ORAL
  Filled 2011-12-23 (×8): qty 1

## 2011-12-23 MED ORDER — FERROUS SULFATE 325 (65 FE) MG PO TABS
325.0000 mg | ORAL_TABLET | Freq: Two times a day (BID) | ORAL | Status: DC
  Administered 2011-12-23 – 2011-12-25 (×4): 325 mg via ORAL
  Filled 2011-12-23 (×4): qty 1

## 2011-12-23 MED ORDER — SENNOSIDES-DOCUSATE SODIUM 8.6-50 MG PO TABS
2.0000 | ORAL_TABLET | Freq: Every day | ORAL | Status: DC
  Administered 2011-12-23 – 2011-12-24 (×2): 2 via ORAL

## 2011-12-23 MED ORDER — WITCH HAZEL-GLYCERIN EX PADS: 1.0000 "application " | MEDICATED_PAD | CUTANEOUS | Status: DC | PRN

## 2011-12-23 MED ORDER — TETANUS-DIPHTH-ACELL PERTUSSIS 5-2.5-18.5 LF-MCG/0.5 IM SUSP
0.5000 mL | Freq: Once | INTRAMUSCULAR | Status: AC
  Administered 2011-12-23: 0.5 mL via INTRAMUSCULAR
  Filled 2011-12-23: qty 0.5

## 2011-12-23 MED ORDER — ONDANSETRON HCL 4 MG PO TABS: 4.0000 mg | ORAL_TABLET | ORAL | Status: DC | PRN

## 2011-12-23 MED ORDER — DIBUCAINE 1 % RE OINT: 1.0000 "application " | TOPICAL_OINTMENT | RECTAL | Status: DC | PRN

## 2011-12-23 MED ORDER — PRENATAL MULTIVITAMIN CH
1.0000 | ORAL_TABLET | Freq: Every day | ORAL | Status: DC
  Administered 2011-12-23 – 2011-12-24 (×2): 1 via ORAL
  Filled 2011-12-23 (×2): qty 1

## 2011-12-23 MED ORDER — DIPHENHYDRAMINE HCL 25 MG PO CAPS: 25.0000 mg | ORAL_CAPSULE | Freq: Four times a day (QID) | ORAL | Status: DC | PRN

## 2011-12-23 MED ORDER — BENZOCAINE-MENTHOL 20-0.5 % EX AERO
1.0000 "application " | INHALATION_SPRAY | CUTANEOUS | Status: DC | PRN
  Administered 2011-12-23: 1 via TOPICAL
  Filled 2011-12-23: qty 56

## 2011-12-23 MED ORDER — ONDANSETRON HCL 4 MG/2ML IJ SOLN: 4.0000 mg | INTRAMUSCULAR | Status: DC | PRN

## 2011-12-23 MED ORDER — OXYCODONE-ACETAMINOPHEN 5-325 MG PO TABS
1.0000 | ORAL_TABLET | ORAL | Status: DC | PRN
  Administered 2011-12-24: 1 via ORAL
  Filled 2011-12-23: qty 1

## 2011-12-23 MED ORDER — LANOLIN HYDROUS EX OINT: TOPICAL_OINTMENT | CUTANEOUS | Status: DC | PRN

## 2011-12-23 MED ORDER — SIMETHICONE 80 MG PO CHEW: 80.0000 mg | CHEWABLE_TABLET | ORAL | Status: DC | PRN

## 2011-12-23 MED ORDER — ZOLPIDEM TARTRATE 5 MG PO TABS: 5.0000 mg | ORAL_TABLET | Freq: Every evening | ORAL | Status: DC | PRN

## 2011-12-23 NOTE — Progress Notes (Signed)
Pain not under control with multiple attempts of epidural bolus, IV pain medication. Md aware

## 2011-12-23 NOTE — Progress Notes (Signed)
INTERVAL NOTE:  S:  Sitting in bed, BFing initiated, min cramping, (+) voids, mod bleed, denies HA/NV/dizziness  O:  VSS, AAO x 3, NAD  FF bellow U  Small lochia  A / P:   PPD #0  Stable post partum  Routine PP orders  Lima Chillemi, CNM, MSN  12/23/2011 8:59 AM

## 2011-12-23 NOTE — Progress Notes (Signed)
Dr. Cherly Hensen and Dr. Arby Barrette aware of pts pain, not controlled with epidural.

## 2011-12-23 NOTE — Anesthesia Postprocedure Evaluation (Signed)
  Anesthesia Post-op Note  Patient: Diamond [name]  Procedure(s) Performed: * No procedures listed *  Patient Location: PACU and Mother/Baby  Anesthesia Type: Epidural  Level of Consciousness: awake, alert  and oriented  Airway and Oxygen Therapy: Patient Spontanous Breathing  Post-op Pain: none  Post-op Assessment: Post-op Vital signs reviewed and Patient's Cardiovascular Status Stable  Post-op Vital Signs: Reviewed and stable  Complications: No apparent anesthesia complications

## 2011-12-23 NOTE — Progress Notes (Signed)
S: S/p epidural. Has Harrington rods. Notes still with pain  O:  BP 149 /77 baseline 150 (+) early decel Ctx ( triplets) then spaced followed by quad ctx or triplet  VE: 5 cm /90/-3 per RN  IMP: arrest of dilation due to dysfunctional labor pattern GBS cx (+) on IV PCN Chronic HTN P) cont trying to achieve pain control. Cont with increase pitocin

## 2011-12-24 LAB — CBC
HCT: 31.4 % — ABNORMAL LOW (ref 36.0–46.0)
Hemoglobin: 10.1 g/dL — ABNORMAL LOW (ref 12.0–15.0)
MCHC: 32.2 g/dL (ref 30.0–36.0)
MCV: 86 fL (ref 78.0–100.0)
RDW: 15.1 % (ref 11.5–15.5)

## 2011-12-24 NOTE — Progress Notes (Signed)
Patient ID: Diamond [name], female   DOB: 01-26-1986, 26 y.o.   MRN: [id number]  PPD 1 SVD  S:  Reports feeling well - slept well last pm x 4 hour then intermittent feeding schedule             Tolerating po/ No nausea or vomiting             Bleeding is light             Pain controlled withprescription NSAID's including motrin only             Up ad lib / ambulatory  Newborn breast feeding well  / female   O:  A & O x 3 NAD             VS: Blood pressure 115/72, pulse 79, temperature 97.6 F (36.4 C), temperature source Oral, resp. rate 18, height 5\' 8"  (1.727 m), weight 141.522 kg (312 lb), SpO2 99.00%, unknown if currently breastfeeding.  LABS: WBC/Hgb/Hct/Plts:  11.8/10.1/31.4/173 (07/18 0612)   Lungs: Clear and unlabored  Heart: regular rate and rhythm / no mumurs  Abdomen: soft, non-tender, non-distended              Fundus: firm, non-tender, U-1  Perineum: mild edema  Lochia: light  Extremities: trace edema, no calf pain or tenderness    A: PPD # 1   Doing well - stable status  P:  Routine post partum orders  Anticipate d/c in am  Marlinda Mike CNM, MSN 12/24/2011, 8:41 AM

## 2011-12-25 MED ORDER — FERROUS SULFATE 325 (65 FE) MG PO TABS: 325.0000 mg | ORAL_TABLET | Freq: Two times a day (BID) | ORAL | Status: DC

## 2011-12-25 MED ORDER — IBUPROFEN 600 MG PO TABS: 600.0000 mg | ORAL_TABLET | Freq: Four times a day (QID) | ORAL | Status: AC

## 2011-12-25 MED ORDER — DOCUSATE SODIUM 100 MG PO CAPS: 100.0000 mg | ORAL_CAPSULE | Freq: Two times a day (BID) | ORAL | Status: AC | PRN

## 2011-12-25 NOTE — Progress Notes (Addendum)
Patient ID: Diamond [name], female   DOB: 1985-10-24, 26 y.o.   MRN: [id number] PPD # 2  Subjective: Pt reports feeling well and eager for d/c home/ Pain controlled with ibuprofen Tolerating po/ Voiding without problems/ No n/v Bleeding is light/ Newborn info:  Information for the patient's newborn:  Myhand, Girl [name] [[id number]]  female Feeding: breast    Objective:  VS: Blood pressure 121/72, pulse 84, temperature 97.8 F (36.6 C), temperature source Oral, resp. rate 20.   Basename 12/24/11 0612  WBC 11.8*  HGB 10.1*  HCT 31.4*  PLT 173    Blood type: --/--/O POS, O POS (07/16 0730) Rubella: Immune (12/29 0000)    Physical Exam:  General: A & O x 3  alert, cooperative and no distress CV: Regular rate and rhythm Resp: clear Abdomen: soft, nontender, normal bowel sounds Uterine Fundus: firm, below umbilicus, nontender Perineum: not inspected and pt is dressed and infant is nursing Ext: Homans sign is negative, no sign of DVT and no edema, redness or tenderness in the calves or thighs    A/P: PPD # 2/ G2P1011/ S/P:spontaneous vaginal delivery Hx chronic HTN; no meds; stable pp Doing well and stable for discharge home RX: Ibuprofen 600mg  po Q 6 hrs prn pain #30 Refill x 1 Niferex 150mg  po QD #30 Refill x 1 WOB/GYN booklet given Routine pp visit in 6wks   Demetrius Revel, MSN, [facility] 12/25/2011, 9:41 AM

## 2011-12-25 NOTE — Discharge Summary (Signed)
Obstetric Discharge Summary Reason for Admission: induction of labor and hx chronic HTN; 39wks Prenatal Procedures: NST and ultrasound Intrapartum Procedures: spontaneous vaginal delivery Postpartum Procedures: none Complications-Operative and Postpartum: 2nd degree perineal laceration Hemoglobin  Date Value Range Status  12/24/2011 10.1* 12.0 - 15.0 g/dL Final     HCT  Date Value Range Status  12/24/2011 31.4* 36.0 - 46.0 % Final    Physical Exam:  General: alert, cooperative and no distress Lochia: appropriate Uterine Fundus: firm Incision: n/a DVT Evaluation: No evidence of DVT seen on physical exam.  Discharge Diagnoses: Term Pregnancy-delivered; Mild ABL Anemia  Discharge Information: Date: 12/25/2011 Activity: pelvic rest Diet: routine Medications: PNV, Ibuprofen, Colace and Iron Condition: stable Instructions: refer to practice specific booklet Discharge to: home   Newborn Data: Live born female on 12/23/11 Birth Weight: 8 lb 2.5 oz (3700 g) APGAR: 7, 9  Home with mother.  Hiram Mciver K 12/25/2011, 9:48 AM

## 2011-12-30 ENCOUNTER — Ambulatory Visit (HOSPITAL_COMMUNITY): Admission: RE | Admit: 2011-12-30 | Payer: 59 | Source: Ambulatory Visit

## 2011-12-31 ENCOUNTER — Ambulatory Visit (HOSPITAL_COMMUNITY)
Admission: RE | Admit: 2011-12-31 | Discharge: 2011-12-31 | Disposition: A | Discharge status: Home / Self Care | Payer: 59 | Source: Ambulatory Visit | Attending: Obstetrics and Gynecology | Admitting: Obstetrics and Gynecology

## 2011-12-31 DIAGNOSIS — M7989 Other specified soft tissue disorders: Secondary | ICD-10-CM

## 2011-12-31 DIAGNOSIS — M79606 Pain in leg, unspecified: Secondary | ICD-10-CM

## 2011-12-31 DIAGNOSIS — M79609 Pain in unspecified limb: Secondary | ICD-10-CM

## 2011-12-31 NOTE — Progress Notes (Signed)
VASCULAR LAB PRELIMINARY  PRELIMINARY  PRELIMINARY  PRELIMINARY  Right lower extremity venous duplex has been  completed.    Preliminary report: Right lower extremity is negative for deep and superficial vein thrombosis.  Report called to Dr. Cherly Hensen and she spoke with the patient.  Lawernce Earll, 12/31/2011, 11:21 AM

## 2014-04-09 ENCOUNTER — Encounter (HOSPITAL_COMMUNITY): Payer: Self-pay

## 2014-11-23 ENCOUNTER — Emergency Department (HOSPITAL_COMMUNITY)
Admission: EM | Admit: 2014-11-23 | Discharge: 2014-11-23 | Disposition: A | Discharge status: Home / Self Care | Payer: Self-pay | Attending: Emergency Medicine | Admitting: Emergency Medicine

## 2014-11-23 ENCOUNTER — Emergency Department (HOSPITAL_COMMUNITY): Payer: Self-pay

## 2014-11-23 ENCOUNTER — Encounter (HOSPITAL_COMMUNITY): Payer: Self-pay | Admitting: Emergency Medicine

## 2014-11-23 DIAGNOSIS — Z8619 Personal history of other infectious and parasitic diseases: Secondary | ICD-10-CM | POA: Insufficient documentation

## 2014-11-23 DIAGNOSIS — K219 Gastro-esophageal reflux disease without esophagitis: Secondary | ICD-10-CM | POA: Insufficient documentation

## 2014-11-23 DIAGNOSIS — R079 Chest pain, unspecified: Secondary | ICD-10-CM | POA: Insufficient documentation

## 2014-11-23 DIAGNOSIS — Z8739 Personal history of other diseases of the musculoskeletal system and connective tissue: Secondary | ICD-10-CM | POA: Insufficient documentation

## 2014-11-23 DIAGNOSIS — Z79899 Other long term (current) drug therapy: Secondary | ICD-10-CM | POA: Insufficient documentation

## 2014-11-23 DIAGNOSIS — Z3202 Encounter for pregnancy test, result negative: Secondary | ICD-10-CM | POA: Insufficient documentation

## 2014-11-23 DIAGNOSIS — Z87891 Personal history of nicotine dependence: Secondary | ICD-10-CM | POA: Insufficient documentation

## 2014-11-23 DIAGNOSIS — E669 Obesity, unspecified: Secondary | ICD-10-CM | POA: Insufficient documentation

## 2014-11-23 LAB — CBC
HCT: 37.1 % (ref 36.0–46.0)
Hemoglobin: 11.6 g/dL — ABNORMAL LOW (ref 12.0–15.0)
MCH: 26.9 pg (ref 26.0–34.0)
MCHC: 31.3 g/dL (ref 30.0–36.0)
MCV: 85.9 fL (ref 78.0–100.0)
PLATELETS: 222 10*3/uL (ref 150–400)
RBC: 4.32 MIL/uL (ref 3.87–5.11)
RDW: 14.2 % (ref 11.5–15.5)
WBC: 6.3 10*3/uL (ref 4.0–10.5)

## 2014-11-23 LAB — URINALYSIS, ROUTINE W REFLEX MICROSCOPIC
BILIRUBIN URINE: NEGATIVE
Glucose, UA: NEGATIVE mg/dL
Ketones, ur: NEGATIVE mg/dL
Leukocytes, UA: NEGATIVE
NITRITE: NEGATIVE
PROTEIN: NEGATIVE mg/dL
Specific Gravity, Urine: 1.006 (ref 1.005–1.030)
UROBILINOGEN UA: 0.2 mg/dL (ref 0.0–1.0)
pH: 6 (ref 5.0–8.0)

## 2014-11-23 LAB — BASIC METABOLIC PANEL
ANION GAP: 8 (ref 5–15)
BUN: 10 mg/dL (ref 6–20)
CHLORIDE: 106 mmol/L (ref 101–111)
CO2: 24 mmol/L (ref 22–32)
Calcium: 9.2 mg/dL (ref 8.9–10.3)
Creatinine, Ser: 0.8 mg/dL (ref 0.44–1.00)
Glucose, Bld: 104 mg/dL — ABNORMAL HIGH (ref 65–99)
POTASSIUM: 3.7 mmol/L (ref 3.5–5.1)
Sodium: 138 mmol/L (ref 135–145)

## 2014-11-23 LAB — BRAIN NATRIURETIC PEPTIDE: B NATRIURETIC PEPTIDE 5: 16.8 pg/mL (ref 0.0–100.0)

## 2014-11-23 LAB — POC URINE PREG, ED: PREG TEST UR: NEGATIVE

## 2014-11-23 LAB — I-STAT TROPONIN, ED: TROPONIN I, POC: 0 ng/mL (ref 0.00–0.08)

## 2014-11-23 LAB — URINE MICROSCOPIC-ADD ON

## 2014-11-23 MED ORDER — OMEPRAZOLE 20 MG PO CPDR: 20.0000 mg | DELAYED_RELEASE_CAPSULE | Freq: Every day | ORAL | Status: DC

## 2014-11-23 NOTE — ED Notes (Signed)
Pt co chest pain started at 1200, has same pain previously and had normal EKG yet HTN. No hx of anxiety. Pt denies nausea yet reports shortness of breath.

## 2014-11-23 NOTE — ED Provider Notes (Signed)
CSN: [id number]     Arrival date & time 11/23/14  1401 History   First MD Initiated Contact with Patient 11/23/14 1756     Chief Complaint  Patient presents with  . Chest Pain     (Consider location/radiation/quality/duration/timing/severity/associated sxs/prior Treatment) Patient is a 29 y.o. female presenting with chest pain. The history is provided by the patient and medical records. No language interpreter was used.  Chest Pain Associated symptoms: no abdominal pain, no back pain, no cough, no diaphoresis, no fatigue, no fever, no headache, no nausea, no shortness of breath and not vomiting      JUNICE FEI is a 29 y.o. female  with no medical problems presents to the Emergency Department complaining of gradual, progressively worsening and then improving chest "tightness" under her left breast onset 12:00pm.  This was rated at a 3/10.  She denies pain is her chest, central chest tightness, SOB, nausea, diaphoresis, weakness, near syncope.  Pt reports the discomfort has improved significantly but has not completely resolved.  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  Pt reports she had a similar episode on Wednesday afternoon.  At that time she had right upper anterior chest "tightening" that radiated up the right side of the neck.  She reports she helped her mother move some heavy items on Wednesday before the pain started.   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 Pt denies associated symptoms.  She denies worsening with exertion, DOE or orthopnea.  Nothing makes it better and nothing makes it worse.  Pt denies fever,chill, headaches, .  She denies hx of HTN, but notes that is was high on Wed when assess by EMS for her episode of CP.  She denies exogenous estrogen, immobilization, long trips, surgery or broken bones.  Pt also reports she has recently had a very poor diet of fast food for the last few months. She endorses some reflux symptoms, worse  at night.    Past Medical History  Diagnosis Date  . Scoliosis   . Hypertension     no meds  . Obesity   . H/O chlamydia infection   . H/O varicella   . Other specified antenatal screening(V28.89)     AFP4  . Postpartum care following vaginal delivery (7/17) 12/23/2011  . Chronic hypertension in pregnancy 12/23/2011   Past Surgical History  Procedure Laterality Date  . Spinal growth rods     Family History  Problem Relation Age of Onset  . Hypertension Mother   . Anesthesia problems Neg Hx    History  Substance Use Topics  . Smoking status: Former Games developer  . Smokeless tobacco: Never Used  . Alcohol Use: No   OB History    Gravida Para Term Preterm AB TAB SAB Ectopic Multiple Living   Review of Systems  Constitutional: Negative for fever, diaphoresis, appetite change, fatigue and unexpected weight change.  HENT: Negative for mouth sores.   Eyes: Negative for visual disturbance.  Respiratory: Negative for cough, chest tightness, shortness of breath and wheezing.   Cardiovascular: Positive for chest pain.  Gastrointestinal: Negative for nausea, vomiting, abdominal pain, diarrhea and constipation.  Endocrine: Negative for polydipsia, polyphagia and polyuria.  Genitourinary: Negative for dysuria, urgency, frequency and hematuria.  Musculoskeletal: Negative for back pain and neck stiffness.  Skin: Negative for rash.  Allergic/Immunologic: Negative for immunocompromised state.  Neurological:  Negative for syncope, light-headedness and headaches.  Hematological: Does not bruise/bleed easily.  Psychiatric/Behavioral: Negative for sleep disturbance. The patient is not nervous/anxious.       Allergies  Review of patient's allergies indicates no known allergies.  Home Medications   Prior to Admission medications   Medication Sig Start Date End Date Taking? Authorizing Provider  ferrous sulfate 325 (65 FE) MG tablet Take 1 tablet (325 mg total) by mouth  2 (two) times daily with a meal. 12/25/11 12/24/12  Arlana Lindau, NP  omeprazole (PRILOSEC) 20 MG capsule Take 1 capsule (20 mg total) by mouth daily. 11/23/14   Alvin Diffee, PA-C  Prenatal Vit-Fe Fumarate-FA (PRENATAL MULTIVITAMIN) TABS Take 1 tablet by mouth daily.    Historical Provider, MD   BP 158/79 mmHg  Pulse 61  Temp(Src) 98.2 F (36.8 C)  Resp 16  SpO2 100%  LMP 11/23/2014 Physical Exam  Constitutional: She appears well-developed and well-nourished. No distress.  Awake, alert, nontoxic appearance Obese  HENT:  Head: Normocephalic and atraumatic.  Mouth/Throat: Oropharynx is clear and moist. No oropharyngeal exudate.  Eyes: Conjunctivae are normal. No scleral icterus.  Neck: Normal range of motion. Neck supple.  Cardiovascular: Normal rate, regular rhythm, normal heart sounds and intact distal pulses.   No murmur heard. Pulmonary/Chest: Effort normal and breath sounds normal. No respiratory distress. She has no wheezes. She exhibits tenderness (mild TTP of the left chest wall under the left breast).  Equal chest expansion Clear and equal breath sounds  Abdominal: Soft. Bowel sounds are normal. She exhibits no mass. There is no tenderness. There is no rebound and no guarding.  Musculoskeletal: Normal range of motion. She exhibits no edema.  Neurological: She is alert.  Speech is clear and goal oriented Moves extremities without ataxia  Skin: Skin is warm and dry. She is not diaphoretic.  Psychiatric: She has a normal mood and affect.  Nursing note and vitals reviewed.   ED Course  Procedures (including critical care time) Labs Review Labs Reviewed  BASIC METABOLIC PANEL - Abnormal; Notable for the following:    Glucose, Bld 104 (*)    All other components within normal limits  CBC - Abnormal; Notable for the following:    Hemoglobin 11.6 (*)    All other components within normal limits  BRAIN NATRIURETIC PEPTIDE  URINALYSIS, ROUTINE W REFLEX MICROSCOPIC (NOT  AT [facility])  Rosezena Sensor, ED  POC URINE PREG, ED    Imaging Review Dg Chest 2 View  11/23/2014   CLINICAL DATA:  Chest pain, shortness of breath  EXAM: CHEST  2 VIEW  COMPARISON:  None.  FINDINGS: There is no focal parenchymal opacity. There is no pleural effusion or pneumothorax. The heart and mediastinal contours are unremarkable.  There are to spinal fixation rods transfixing the thoracolumbar spine.  IMPRESSION: No active cardiopulmonary disease.   Electronically Signed   By: Elige Ko   On: 11/23/2014 15:30     EKG Interpretation None      MDM   Final diagnoses:  Chest pain  Gastroesophageal reflux disease, esophagitis presence not specified   Erroll Luna presents with chest pain.  Chest pain is not likely of cardiac or pulmonary etiology d/t presentation, PERC negative, HEART score 0, VSS, no tracheal deviation, no JVD or new murmur, RRR, breath sounds equal bilaterally, EKG without acute abnormalities, negative troponin, and negative CXR. Pt endorses reflux symptoms, though I doubt this is the cause of today's symptoms. Will treat with omeprazole.  No  evidence of ACS or PE.    Patient noted to be slightly hypertensive in the emergency department.  No signs of hypertensive urgency.  Discussed decrease in diet salt intake with patient and the need for close follow-up and management by their primary care physician.   Pt has been advised to return to the ED if CP becomes exertional, associated with diaphoresis or nausea, radiates to left jaw/arm, worsens or becomes concerning in any way. Pt appears reliable for follow up and is agreeable to discharge.   Patient is to be discharged with recommendation to follow up with PCP in regards to today's hospital visit.    BP 158/79 mmHg  Pulse 61  Temp(Src) 98.2 F (36.8 C)  Resp 16  SpO2 100%  LMP 11/23/2014     Dahlia Client Rakisha Pincock, PA-C 11/23/14 1831  Purvis Sheffield, MD 11/23/14 [phone]

## 2014-11-23 NOTE — Discharge Instructions (Signed)
1. Medications: omeprazole, usual home medications 2. Treatment: rest, drink plenty of fluids,  3. Follow Up: Please followup with your primary doctor in 2 days for discussion of your diagnoses and further evaluation after today's visit; if you do not have a primary care doctor use the resource guide provided to find one; Please return to the ER for worsening symptoms including CP associated with SOB, nausea, near syncope    Chest Pain (Nonspecific) It is often hard to give a specific diagnosis for the cause of chest pain. There is always a chance that your pain could be related to something serious, such as a heart attack or a blood clot in the lungs. You need to follow up with your health care provider for further evaluation. CAUSES   Heartburn.  Pneumonia or bronchitis.  Anxiety or stress.  Inflammation around your heart (pericarditis) or lung (pleuritis or pleurisy).  A blood clot in the lung.  A collapsed lung (pneumothorax). It can develop suddenly on its own (spontaneous pneumothorax) or from trauma to the chest.  Shingles infection (herpes zoster virus). The chest wall is composed of bones, muscles, and cartilage. Any of these can be the source of the pain.  The bones can be bruised by injury.  The muscles or cartilage can be strained by coughing or overwork.  The cartilage can be affected by inflammation and become sore (costochondritis). DIAGNOSIS  Lab tests or other studies may be needed to find the cause of your pain. Your health care provider may have you take a test called an ambulatory electrocardiogram (ECG). An ECG records your heartbeat patterns over a 24-hour period. You may also have other tests, such as:  Transthoracic echocardiogram (TTE). During echocardiography, sound waves are used to evaluate how blood flows through your heart.  Transesophageal echocardiogram (TEE).  Cardiac monitoring. This allows your health care provider to monitor your heart rate and  rhythm in real time.  Holter monitor. This is a portable device that records your heartbeat and can help diagnose heart arrhythmias. It allows your health care provider to track your heart activity for several days, if needed.  Stress tests by exercise or by giving medicine that makes the heart beat faster. TREATMENT   Treatment depends on what may be causing your chest pain. Treatment may include:  Acid blockers for heartburn.  Anti-inflammatory medicine.  Pain medicine for inflammatory conditions.  Antibiotics if an infection is present.  You may be advised to change lifestyle habits. This includes stopping smoking and avoiding alcohol, caffeine, and chocolate.  You may be advised to keep your head raised (elevated) when sleeping. This reduces the chance of acid going backward from your stomach into your esophagus. Most of the time, nonspecific chest pain will improve within 2-3 days with rest and mild pain medicine.  HOME CARE INSTRUCTIONS   If antibiotics were prescribed, take them as directed. Finish them even if you start to feel better.  For the next few days, avoid physical activities that bring on chest pain. Continue physical activities as directed.  Do not use any tobacco products, including cigarettes, chewing tobacco, or electronic cigarettes.  Avoid drinking alcohol.  Only take medicine as directed by your health care provider.  Follow your health care provider's suggestions for further testing if your chest pain does not go away.  Keep any follow-up appointments you made. If you do not go to an appointment, you could develop lasting (chronic) problems with pain. If there is any problem keeping an appointment,  call to reschedule. SEEK MEDICAL CARE IF:   Your chest pain does not go away, even after treatment.  You have a rash with blisters on your chest.  You have a fever. SEEK IMMEDIATE MEDICAL CARE IF:   You have increased chest pain or pain that spreads to  your arm, neck, jaw, back, or abdomen.  You have shortness of breath.  You have an increasing cough, or you cough up blood.  You have severe back or abdominal pain.  You feel nauseous or vomit.  You have severe weakness.  You faint.  You have chills. This is an emergency. Do not wait to see if the pain will go away. Get medical help at once. Call your local emergency services (911 in U.S.). Do not drive yourself to the hospital. MAKE SURE YOU:   Understand these instructions.  Will watch your condition.  Will get help right away if you are not doing well or get worse. Document Released: 03/04/2005 Document Revised: 05/30/2013 Document Reviewed: 12/29/2007 [facility] Patient Information 2015 [place], [place]. This information is not intended to replace advice given to you by your health care provider. Make sure you discuss any questions you have with your health care provider.    Emergency Department Resource Guide 1) Find a Doctor and Pay Out of Pocket Although you won't have to find out who is covered by your insurance plan, it is a good idea to ask around and get recommendations. You will then need to call the office and see if the doctor you have chosen will accept you as a new patient and what types of options they offer for patients who are self-pay. Some doctors offer discounts or will set up payment plans for their patients who do not have insurance, but you will need to ask so you aren't surprised when you get to your appointment.  2) Contact Your Local Health Department Not all health departments have doctors that can see patients for sick visits, but many do, so it is worth a call to see if yours does. If you don't know where your local health department is, you can check in your phone book. The CDC also has a tool to help you locate your state's health department, and many state websites also have listings of all of their local health departments.  3) Find a Walk-in  Clinic If your illness is not likely to be very severe or complicated, you may want to try a walk in clinic. These are popping up all over the country in pharmacies, drugstores, and shopping centers. They're usually staffed by nurse practitioners or physician assistants that have been trained to treat common illnesses and complaints. They're usually fairly quick and inexpensive. However, if you have serious medical issues or chronic medical problems, these are probably not your best option.  No Primary Care Doctor: - Call Health Connect at  [phone] - they can help you locate a primary care doctor that  accepts your insurance, provides certain services, etc. - Physician Referral Service- [phone]  Chronic Pain Problems: Organization         Address  Phone   Notes  [facility]  [phone] Patients need to be referred by their primary care doctor.   Medication Assistance: Organization         Address  Phone   Notes  [facility] Medication [place] [address] [place], [place] [address] [phone] --Must be a resident of [facility] --  Must have NO insurance coverage whatsoever (no Medicaid/ Medicare, etc.) -- The pt. MUST have a primary care doctor that directs their care regularly and follows them in the community   [facility]ist  [phone]   [employer]  [phone]    Agencies that provide inexpensive medical care: Organization         Address  Phone   Notes  [facility]  [phone]   [facility]    [phone]   [facility] [address] [place], [place] [address] [phone]   [facility] [address] [place] [place] [phone]   Planned Parenthood    [phone]   [facility]    [phone]   Community Health and [facility]  [address], [place] Phone:  [phone], Fax:  [phone] Hours  of Operation:  9 am - 6 pm, M-F.  Also accepts Medicaid/Medicare and self-pay.  [facility]  [address], [place] Phone: [phone], Fax: [phone]. Hours of Operation:  8:30 am - 5:30 pm, M-F.  Also accepts Medicaid and self-pay.  [facility] [place] [address], [place] Phone: [phone]   [facility] [address] Natasha Bence [place], [place] [phone], Ext. [phone] Mondays & Thursdays: 7-9 AM.  First 15 patients are seen on a first come, first serve basis.    Medicaid-accepting [facility] Providers:  Organization         Address  Phone   Notes  [facility] [address], [place] [phone] Also accepts self-pay patients.  [facility] [address] Laurell Josephs [place], [place]  [phone]   [facility] [address], [place] [phone]   [facility] [address] [place] [phone]   Renaye Rakers [address], [place]   [phone] Only accepts [place] Access [place] patients after they have their name applied to their card.   Self-Pay (no insurance) in [facility]:  Organization         Address  Phone   Notes  Sickle Cell Patients, [facility] [address] [place], [place] [phone]   [facility] [address] [place], [place] [phone]   [facility] [place]  [address],  [phone]   [facility]/Dr. Osei-Bonsu  [address] [place] or [address], [place] [phone] Phone number for both [place] and [place] locations is the same.  [facility]gent [facility] and [facility] [address] [place] [phone]   [facility] [address] [place] or [address] [phone]   [facility] [address] [place] [phone], phone; [phone], fax Sees patients 1st and 3rd Saturday of every month.  Must not qualify for public or private insurance (i.e. Medicaid, Medicare, [place] Health Choice, Veterans' Benefits)  Household income should be no more than 200% of the poverty level The clinic cannot treat you if you are pregnant or think you are pregnant  Sexually transmitted diseases are not treated at the clinic.    Dental Care: Organization         Address  Phone  Notes  [facility] Department of [facility] [address] [place], [place] [phone] Accepts children up to age 41 who are enrolled in [place] or [place] Health Choice; pregnant women with a Medicaid card; and children who have applied for Medicaid or [place] Health Choice, but were declined, whose parents can pay a reduced fee at time of service.  [facility] of [facility]  [address], [place] [phone] Accepts children up to age 15 who are enrolled in [place] or [place] Health Choice; pregnant women with a Medicaid card; and children who have applied for Medicaid or [place] Health Choice, but were declined, whose parents can pay a reduced fee at time of service.  [facility]  [address] [place], [place] [phone] Patients are seen by appointment only. Walk-ins are not accepted. [facility] will see patients 86 years of age and older. Monday - Tuesday (8am-5pm) Most Wednesdays (8:30-5pm) $30 per visit, cash only  [facility]  [address], [facility] [phone] Patients are seen by appointment only. Walk-ins are not accepted. [facility] will see patients 66 years of age and older. One Wednesday Evening (Monthly: Volunteer Based).  $30 per visit, cash only  [employer]  [phone] for adults; Children under age 40, call Graduate  Pediatric Dentistry at [phone]. Children aged 62-14, please call [phone] to request a pediatric application.  Dental services are provided in all areas of dental care including fillings, crowns and bridges, complete and partial dentures, implants, gum treatment, root canals, and extractions. Preventive care is also provided. Treatment is provided to both adults and children. Patients are selected via a lottery and there is often a waiting list.   [facility] [address], [place]  [phone] [email or web address]   [facility] [address] [place], [place] [phone], Ext. [phone] Second and Fourth Thursday of each month, opens at 6:30 AM; Clinic ends at 9 AM.  Patients are seen on a first-come first-served basis, and a limited number are seen during each clinic.   [facility]  [address] Ether Griffins [place], [place] [phone]   Eligibility Requirements You must have lived in [place], [place], or [place] counties for at least the last three months.   You cannot be eligible for state or federal sponsored [place], including [employer], [place], or [employer].   You generally cannot be eligible for healthcare insurance through your employer.    How to apply: Eligibility screenings are held every Tuesday and Wednesday afternoon from 1:00 pm until 4:00 pm. You do not need an appointment for the interview!  [facility] [address], [place], [place] [phone]   [facility] Health Department  [phone]   [facility] Health Department  [phone]   [facility] Health Department  [phone]    Behavioral Health Resources in the Community: Intensive Outpatient Programs Organization         Address  Phone  Notes  [facility] Services [address] [place], [place] [phone]   [facility] Outpatient [address], [place], [place]  [phone]   [facility] [address] [place], [place]  [phone]   [facility] Mental Health [address],  [place], [place] [phone] or [phone]   Substance Abuse Resources Organization  Address  Phone  Notes  [facility] Drug [facility]  [phone]   [facility]  [phone]   [facility]  [phone]   Floydene Flock  [phone]   Residential & Outpatient Substance Abuse Program  [phone]   Psychological Services Organization         Address  Phone  Notes  [facility] Behavioral Health  [phone]   [facility] Services  [phone]   [facility] Health [address] [place] [phone] or [phone]    Mobile Crisis Teams Organization         Address  Phone  Notes  [facility], Mobile Crisis Care Unit  [phone]   [facility]  [address] [place], [place] [phone]   Doristine Locks [address] [place] [phone]    Self-Help/Support Groups Organization         Address  Phone             Notes  [facility] [place] - variety of support groups  [phone]- [phone] Call for more information  Narcotics Anonymous (NA), [facility] [address], [employer] [place]  2 meetings at this location   Statistician         Address  Phone  Notes  [facility] [address],    [place]  [phone]   [facility]  [address], [place] [address], [place], [place] [phone]   [facility] [address] [place], [place] [phone] Admissions: 8am-3pm M-F  [facility] [address]    [place], [place] [phone]   [facility] [address] [place], [place], [place] [phone]   [facility] [address]  [place], [place] [phone]   [facility] - Intensive Outpatient [address] Laurell Joseph[address], [place], [place] [phone]   [facility] ([facility]iction [facility] [address]  [place], [place] [phone] or [phone]   [facility] ([facility]) [address], [place], [place] [phone] Accepts Medicaid  [facility] [address]  [place] [phone] Substance Abuse/Addiction Treatment   [facility] Organization         Address  Phone  Notes  [facility]  [phone]   Angie Fava, PhD [address] Ervin Knack [place], [place]   [phone] or [phone]   [facility] Behavioral   [address] [place], [place] [phone]   [facility] [address], [place], [place] [phone] Insurance/Medicaid/sponsorship through [facility] and Families [address]                                    [place], [place] [phone] Therapy/tele-psych/case  [facility] [address], [place] [phone]    Dr. Lolly Mustache  [phone]   [facility] [place] [facility] [place]  [employer] [facility] Dept. 1) [address] [place] 2) [address] [place] 3)  [address], [place] [phone]  [phone]   [facility] Child Abuse Hotline ([phone])[phone] or [phone] (After Hours)

## 2014-12-02 ENCOUNTER — Emergency Department (INDEPENDENT_AMBULATORY_CARE_PROVIDER_SITE_OTHER)
Admission: EM | Admit: 2014-12-02 | Discharge: 2014-12-02 | Disposition: A | Discharge status: Home / Self Care | Payer: Self-pay | Source: Home / Self Care | Attending: Family Medicine | Admitting: Family Medicine

## 2014-12-02 ENCOUNTER — Encounter (HOSPITAL_COMMUNITY): Payer: Self-pay | Admitting: *Deleted

## 2014-12-02 DIAGNOSIS — J0100 Acute maxillary sinusitis, unspecified: Secondary | ICD-10-CM

## 2014-12-02 MED ORDER — IPRATROPIUM BROMIDE 0.06 % NA SOLN: 2.0000 | Freq: Four times a day (QID) | NASAL | Status: DC

## 2014-12-02 MED ORDER — DOXYCYCLINE HYCLATE 100 MG PO CAPS: 100.0000 mg | ORAL_CAPSULE | Freq: Two times a day (BID) | ORAL | Status: DC

## 2014-12-02 NOTE — ED Notes (Signed)
Pt  Reports  Symptoms  Of headache      Congested    Nasal  stuffyness   And  Tingling  Across  Forehead     With  Symptoms   X   sev  Days  Seen  Er  Recently  Had   Work  Up

## 2014-12-02 NOTE — Discharge Instructions (Signed)
Avoid fans in face, drink plenty of fluids, take all of medicine, see your doctor if further problems.

## 2014-12-02 NOTE — ED Provider Notes (Signed)
CSN: [id number]     Arrival date & time 12/02/14  1829 History   First MD Initiated Contact with Patient 12/02/14 1841     Chief Complaint  Patient presents with  . Headache   (Consider location/radiation/quality/duration/timing/severity/associated sxs/prior Treatment) Patient is a 29 y.o. female presenting with URI. The history is provided by the patient.  URI Presenting symptoms: congestion, cough, facial pain and rhinorrhea   Presenting symptoms: no fever   Severity:  Moderate Duration:  5 days Progression:  Unchanged Chronicity:  New (seen in ER 6/17 with mult sx.) Relieved by:  None tried Worsened by:  Nothing tried Ineffective treatments:  None tried   Past Medical History  Diagnosis Date  . Scoliosis   . Hypertension     no meds  . Obesity   . H/O chlamydia infection   . H/O varicella   . Other specified antenatal screening(V28.89)     AFP4  . Postpartum care following vaginal delivery (7/17) 12/23/2011  . Chronic hypertension in pregnancy 12/23/2011   Past Surgical History  Procedure Laterality Date  . Spinal growth rods     Family History  Problem Relation Age of Onset  . Hypertension Mother   . Anesthesia problems Neg Hx    History  Substance Use Topics  . Smoking status: Former Games developer  . Smokeless tobacco: Never Used  . Alcohol Use: No   OB History    Gravida Para Term Preterm AB TAB SAB Ectopic Multiple Living   2 1 1  1 1    1      Review of Systems  Constitutional: Negative.  Negative for fever.  HENT: Positive for congestion and rhinorrhea.   Respiratory: Positive for cough.   Cardiovascular: Negative for palpitations and leg swelling.  Gastrointestinal: Negative.     Allergies  Review of patient's allergies indicates no known allergies.  Home Medications   Prior to Admission medications   Medication Sig Start Date End Date Taking? Authorizing Provider  doxycycline (VIBRAMYCIN) 100 MG capsule Take 1 capsule (100 mg total) by mouth 2  (two) times daily. 12/02/14   Linna Hoff, MD  ferrous sulfate 325 (65 FE) MG tablet Take 1 tablet (325 mg total) by mouth 2 (two) times daily with a meal. 12/25/11 12/24/12  Arlana Lindau, NP  ipratropium (ATROVENT) 0.06 % nasal spray Place 2 sprays into both nostrils 4 (four) times daily. 12/02/14   Linna Hoff, MD  omeprazole (PRILOSEC) 20 MG capsule Take 1 capsule (20 mg total) by mouth daily. 11/23/14   Hannah Muthersbaugh, PA-C  Prenatal Vit-Fe Fumarate-FA (PRENATAL MULTIVITAMIN) TABS Take 1 tablet by mouth daily.    Historical Provider, MD   BP 146/82 mmHg  Pulse 69  Temp(Src) 98.4 F (36.9 C) (Oral)  Resp 16  SpO2 96%  LMP 11/23/2014 Physical Exam  Constitutional: She is oriented to person, place, and time. She appears well-developed and well-nourished. No distress.  HENT:  Head: Normocephalic.  Right Ear: External ear normal.  Left Ear: External ear normal.  Mouth/Throat: Oropharynx is clear and moist.  Eyes: Conjunctivae and EOM are normal. Pupils are equal, round, and reactive to light.  Neck: Normal range of motion. Neck supple.  Cardiovascular: Normal rate, regular rhythm, normal heart sounds and intact distal pulses.   Pulmonary/Chest: Effort normal and breath sounds normal.  Lymphadenopathy:    She has no cervical adenopathy.  Neurological: She is alert and oriented to person, place, and time. No cranial nerve deficit. Coordination normal.  Skin:  Skin is warm and dry.  Nursing note and vitals reviewed.   ED Course  Procedures (including critical care time) Labs Review Labs Reviewed - No data to display  Imaging Review No results found.   MDM   1. Subacute maxillary sinusitis        Linna Hoff, MD 12/02/14 1900

## 2015-01-07 ENCOUNTER — Encounter: Payer: Self-pay | Admitting: Family Medicine

## 2015-01-07 ENCOUNTER — Ambulatory Visit (INDEPENDENT_AMBULATORY_CARE_PROVIDER_SITE_OTHER): Payer: Self-pay | Admitting: Family Medicine

## 2015-01-07 VITALS — BP 131/67 | HR 68 | Temp 98.3°F | Resp 16 | Ht 68.0 in | Wt 275.0 lb

## 2015-01-07 DIAGNOSIS — F411 Generalized anxiety disorder: Secondary | ICD-10-CM

## 2015-01-07 NOTE — Patient Instructions (Signed)
Your symptoms are suggestive of anxiety and labs, EKG and Xrays has not uncovered another reason I would recommend going to [facility] to see mental health professional to help confirm and treat the anxiety I am drawing a TSH to see if this could be a cause of some of your symptoms When you get your orange card or [facility] discount card, call and make an appointment for a PAP smear.

## 2015-01-07 NOTE — Progress Notes (Signed)
Patient ID: Diamond [name], female   DOB: Jul 31, 1985, 29 y.o.   MRN: [id number]   Diamond [name], is a 29 y.o. female  [id number]  [id number]  DOB - [birth year]  CC:  Chief Complaint  Patient presents with  . Establish Care    eposides of heart racing, tingling in hands/feet/and lips        HPI: Diamond [name] is a 29 y.o. female here today to establish care. Her major concern are symptoms that her mother is telling her is anxiety. This started back in the middle of July with a feeling of her heart racing and like she would faint. She called 911 and was checked out by EMS and found to be ok so did  Not go to ED. About a week later on 7/26, she had a second episode and went to ED. No reason was found for her symptoms. An EKG bloodwork, and chest x-ray were normal. On another occasion she went to Urgent care with a weird feeling in her head, not pain but pressure. She was treated for a sinus infection on that occasion.  She has also experienced tingling of her hands and feet and around her mouth, with a hollow feeling in her throat, some nausea, low energy. She also had some blurred vision but has since gotten glasses.  She has had a number of changes in her life recently. She just finished school and has a few job that she describes as being stressful. She feels the symptoms she is experiencing are interfering with her work and home life.Her BP was elevated when she was seen in ED but her BP is normal today.  She denies chronic illnesses.  No Known Allergies Past Medical History  Diagnosis Date  . Scoliosis   . Hypertension     no meds  . Obesity   . H/O chlamydia infection   . H/O varicella   . Other specified antenatal screening(V28.89)     AFP4  . Postpartum care following vaginal delivery (7/17) 12/23/2011  . Chronic hypertension in pregnancy 12/23/2011   Current Outpatient Prescriptions on File Prior to Visit  Medication Sig Dispense Refill  . omeprazole (PRILOSEC) 20 MG capsule Take 1  capsule (20 mg total) by mouth daily. 30 capsule 0  . doxycycline (VIBRAMYCIN) 100 MG capsule Take 1 capsule (100 mg total) by mouth 2 (two) times daily. (Patient not taking: Reported on 01/07/2015) 20 capsule 0  . ferrous sulfate 325 (65 FE) MG tablet Take 1 tablet (325 mg total) by mouth 2 (two) times daily with a meal. 60 tablet 1  . ipratropium (ATROVENT) 0.06 % nasal spray Place 2 sprays into both nostrils 4 (four) times daily. (Patient not taking: Reported on 01/07/2015) 15 mL 1  . Prenatal Vit-Fe Fumarate-FA (PRENATAL MULTIVITAMIN) TABS Take 1 tablet by mouth daily.     No current facility-administered medications on file prior to visit.   Family History  Problem Relation Age of Onset  . Hypertension Mother   . Anesthesia problems Neg Hx    History   Social History  . Marital Status: Single    Spouse Name: N/A  . Number of Children: N/A  . Years of Education: N/A   Occupational History  . Not on file.   Social History Main Topics  . Smoking status: Former Games developer  . Smokeless tobacco: Never Used  . Alcohol Use: No  . Drug Use: No  . Sexual Activity: Yes    Birth Control/ Protection: None  Other Topics Concern  . Not on file   Social History Narrative    Review of Systems: Constitutional: Negative for fever, chills, appetite change, weight loss. Positive for  Fatigue. Skin: Negative for rashes or lesions of concern. HENT: Negative for ear pain, ear discharge.nose bleeds Eyes: Negative for pain, discharge, redness, itching and visual disturbance. Blurred vision that has resolved with glasses. Neck: Negative for pain, stiffness  Respiratory: Negative for cough. Some shortness of breath with these episodes Cardiovascular: Negative for leg swelling. Positive for chest pain and palpitations Gastrointestinal: Negative for abdominal pain, vomiting, diarrhea, constipations. Positive for nausea with episodes Genitourinary: Negative for dysuria, urgency, frequency, hematuria,   Musculoskeletal: Negative for back pain, joint pain, joint  swelling, and gait problem.Negative for weakness. Neurological: Negative for dizziness, tremors, seizures, syncope,   light-headedness Positive for numbness and tingling and pressure in head  Hematological: Negative for easy bruising or bleeding Psychiatric/Behavioral: Negative for depression, decreased concentration, confusion. Possible anxiety   Objective:   Filed Vitals:   01/07/15 1051  BP: 131/67  Pulse: 68  Temp: 98.3 F (36.8 C)  Resp: 16    Physical Exam: Constitutional: Patient appears well-developed and well-nourished. No distress. HENT: Normocephalic, atraumatic, External right and left ear normal. Oropharynx is clear and moist.  Eyes: Conjunctivae and EOM are normal. PERRLA, no scleral icterus. Neck: Normal ROM. Neck supple. No lymphadenopathy, No thyromegaly. CVS: RRR, S1/S2 +, no murmurs, no gallops, no rubs Pulmonary: Effort and breath sounds normal, no stridor, rhonchi, wheezes, rales.  Abdominal: Soft. Normoactive BS,, no distension, tenderness, rebound or guarding.  Musculoskeletal: Normal range of motion. No edema and no tenderness.  Neuro: Alert.Normal muscle tone coordination. Non-focal Skin: Skin is warm and dry. No rash noted. Not diaphoretic. No erythema. No pallor. Psychiatric: Normal mood and affect. Behavior, judgment, thought content normal.  Lab Results  Component Value Date   WBC 6.3 11/23/2014   HGB 11.6* 11/23/2014   HCT 37.1 11/23/2014   MCV 85.9 11/23/2014   PLT 222 11/23/2014   Lab Results  Component Value Date   CREATININE 0.80 11/23/2014   BUN 10 11/23/2014   NA 138 11/23/2014   K 3.7 11/23/2014   CL 106 11/23/2014   CO2 24 11/23/2014    No results found for: HGBA1C Lipid Panel  No results found for: CHOL, TRIG, HDL, CHOLHDL, VLDL, LDLCALC     Assessment and plan:   Establish care -I have reviewed available records and labs and medications -Have reviewed health  maintenance and will have return for PAP. -Cmet and CBC done in June and no issues found  Probably Anxiety -TSH -discussed possible treatment with Zoloft, which she prefers not to do -have recommended she be seen at [facility] for evaluation for anxiety.   To follow-up for PAP in near future.    The patient was given clear instructions to go to ER or return to medical center if symptoms don't improve, worsen or new problems develop. The patient verbalized understanding. The patient was told to call to get lab results if they haven't heard anything in the next week.         01/07/2015, 11:01 AM

## 2015-01-08 LAB — TSH: TSH: 1.71 u[IU]/mL (ref 0.350–4.500)

## 2016-07-16 ENCOUNTER — Ambulatory Visit (INDEPENDENT_AMBULATORY_CARE_PROVIDER_SITE_OTHER): Payer: Commercial Managed Care - PPO | Admitting: Obstetrics and Gynecology

## 2016-07-16 ENCOUNTER — Encounter: Payer: Self-pay | Admitting: Obstetrics and Gynecology

## 2016-07-16 VITALS — BP 134/79 | HR 85 | Temp 99.4°F | Ht 69.0 in | Wt 301.3 lb

## 2016-07-16 DIAGNOSIS — Z0000 Encounter for general adult medical examination without abnormal findings: Secondary | ICD-10-CM | POA: Diagnosis not present

## 2016-07-16 DIAGNOSIS — Z01419 Encounter for gynecological examination (general) (routine) without abnormal findings: Secondary | ICD-10-CM | POA: Diagnosis not present

## 2016-07-16 NOTE — Progress Notes (Signed)
Patient is in the office for annual exam, LMP 06-25-16.

## 2016-07-16 NOTE — Progress Notes (Signed)
Subjective:     Diamond [name] is a 31 y.o. female  G2P1011 with LMP 06/25/2016 and BMI 44 who is here for a comprehensive physical exam. The patient reports no problems. She is abstinent. She reports a monthly period of 5 days. She denies any vaginal discharge or pelvic pain  Past Medical History:  Diagnosis Date  . Chronic hypertension in pregnancy 12/23/2011  . H/O chlamydia infection   . H/O varicella   . Hypertension    no meds  . Obesity   . Other specified antenatal screening(V28.89)    AFP4  . Postpartum care following vaginal delivery (7/17) 12/23/2011  . Scoliosis    Past Surgical History:  Procedure Laterality Date  . SPINAL GROWTH RODS     Family History  Problem Relation Age of Onset  . Hypertension Mother   . Stroke Mother   . Anesthesia problems Neg Hx    Social History   Social History  . Marital status: Single    Spouse name: N/A  . Number of children: N/A  . Years of education: N/A   Occupational History  . Not on file.   Social History Main Topics  . Smoking status: Never Smoker  . Smokeless tobacco: Never Used  . Alcohol use No  . Drug use: No  . Sexual activity: Not Currently    Birth control/ protection: None   Other Topics Concern  . Not on file   Social History Narrative  . No narrative on file   Health Maintenance  Topic Date Due  . PAP SMEAR  06/25/2013  . INFLUENZA VACCINE  01/07/2016  . TETANUS/TDAP  12/22/2021  . HIV Screening  Completed    Review of Systems Pertinent items are noted in HPI.   Objective:      GENERAL: Well-developed, well-nourished female in no acute distress. Obese HEENT: Normocephalic, atraumatic. Sclerae anicteric.  NECK: Supple. Normal thyroid.  LUNGS: Clear to auscultation bilaterally.  HEART: Regular rate and rhythm. BREASTS: Symmetric in size. No palpable masses or lymphadenopathy, skin changes, or nipple drainage. ABDOMEN: Soft, nontender, nondistended. No organomegaly. PELVIC: Normal external  female genitalia. Vagina is pink and rugated.  Normal discharge. Normal appearing cervix. Bimanual exam limited secondary to body habitus. No adnexal mass or tenderness. EXTREMITIES: No cyanosis, clubbing, or edema, 2+ distal pulses.    Assessment:    Healthy female exam.      Plan:    pap smear collected Patient will be contacted with abnormal results Weight loss strategies discussed See After Visit Summary for Counseling Recommendations

## 2016-07-17 LAB — RPR: RPR: NONREACTIVE

## 2016-07-17 LAB — HEPATITIS B SURFACE ANTIGEN: HEP B S AG: NEGATIVE

## 2016-07-17 LAB — HEPATITIS C ANTIBODY

## 2016-07-17 LAB — HIV ANTIBODY (ROUTINE TESTING W REFLEX): HIV SCREEN 4TH GENERATION: NONREACTIVE

## 2016-07-20 LAB — CYTOLOGY - PAP
Adequacy: ABSENT
Chlamydia: NEGATIVE
Diagnosis: NEGATIVE
HPV (WINDOPATH): NOT DETECTED
Neisseria Gonorrhea: NEGATIVE

## 2016-07-29 ENCOUNTER — Telehealth: Payer: Self-pay

## 2016-07-29 NOTE — Telephone Encounter (Signed)
Patient called in for lab results  ?

## 2016-12-20 IMAGING — CR DG CHEST 2V
2 series · 2 of 2 positions shown · non-contrast
Comparison: None.

CLINICAL DATA: Chest pain, shortness of breath

EXAM:
CHEST  2 VIEW

[w chest pa]
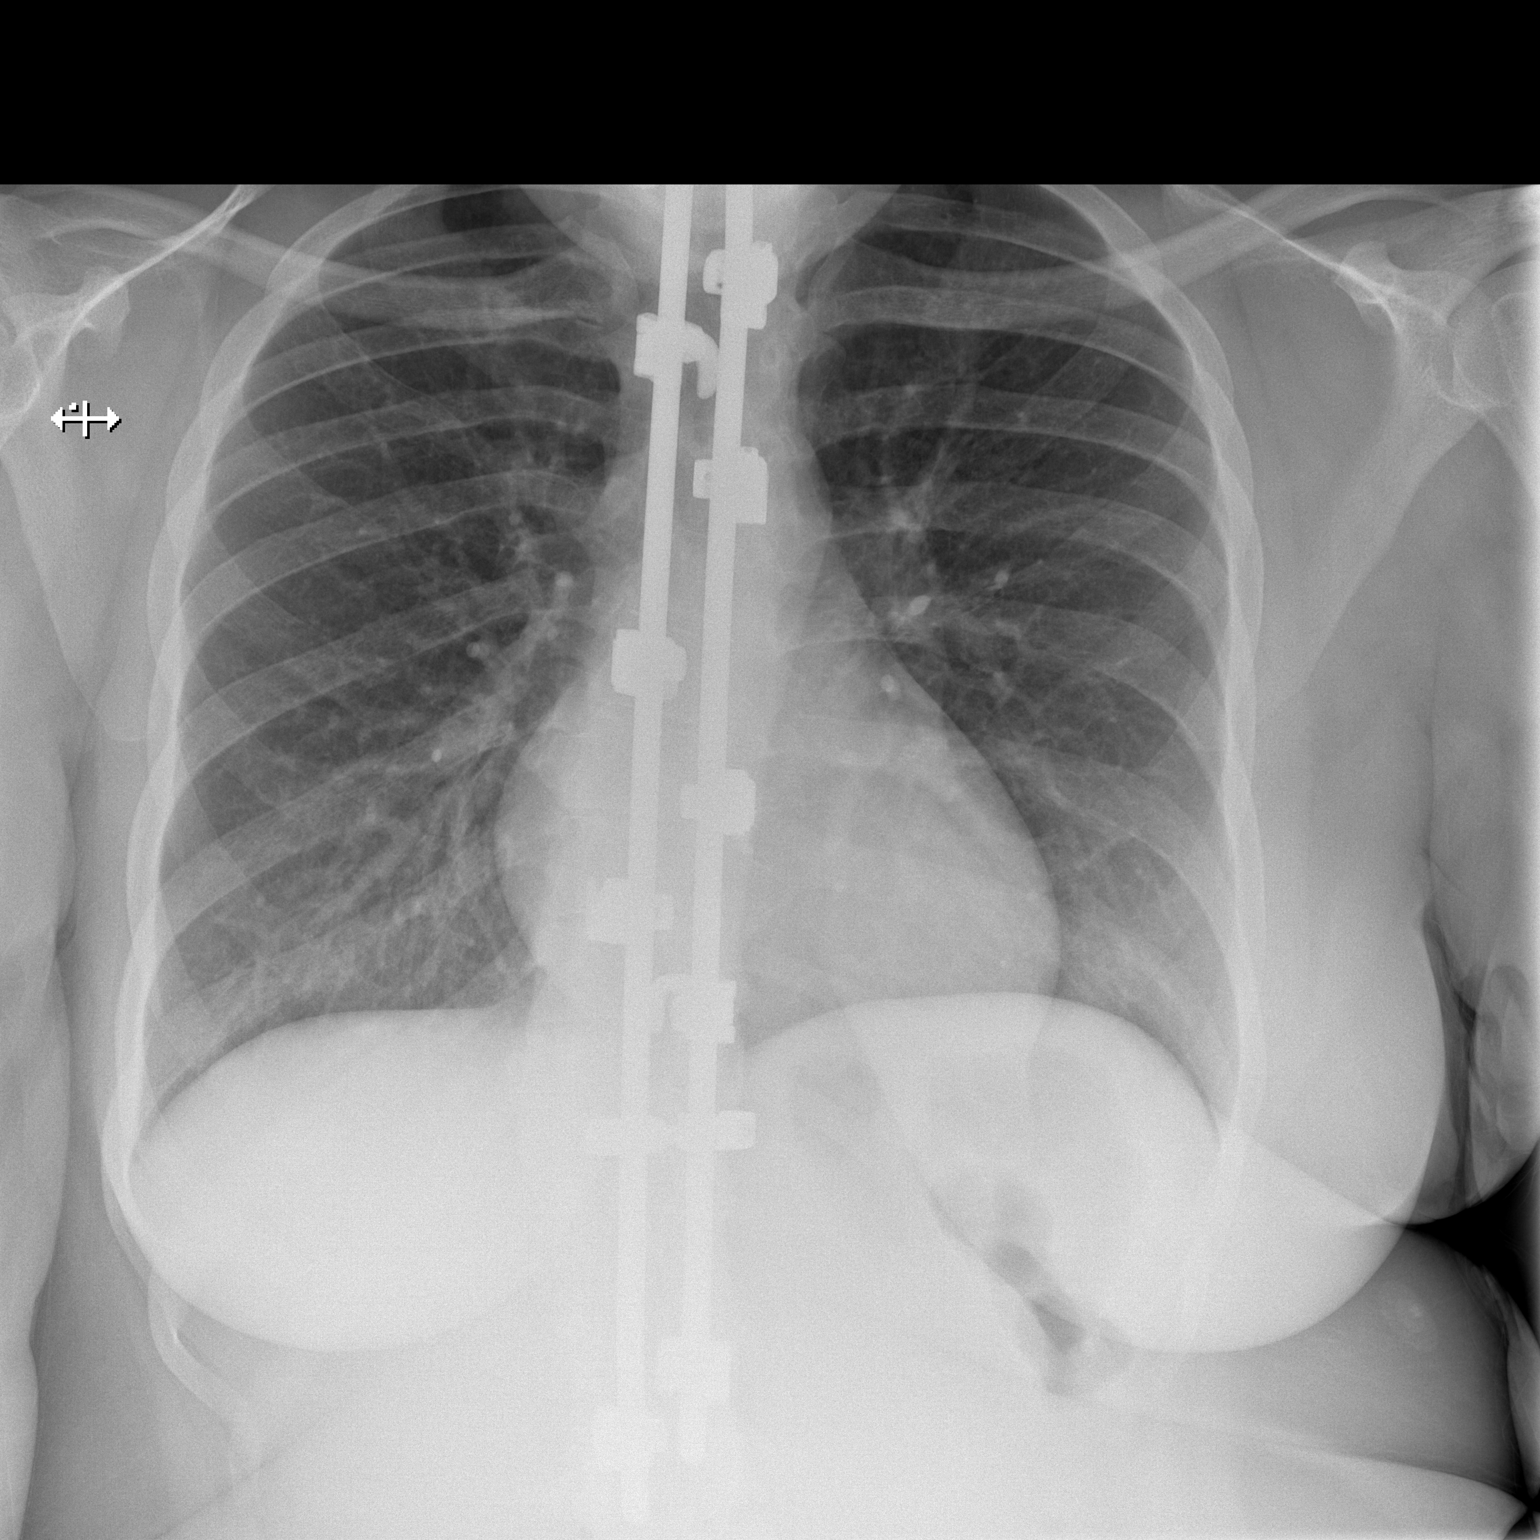

[w chest lat]
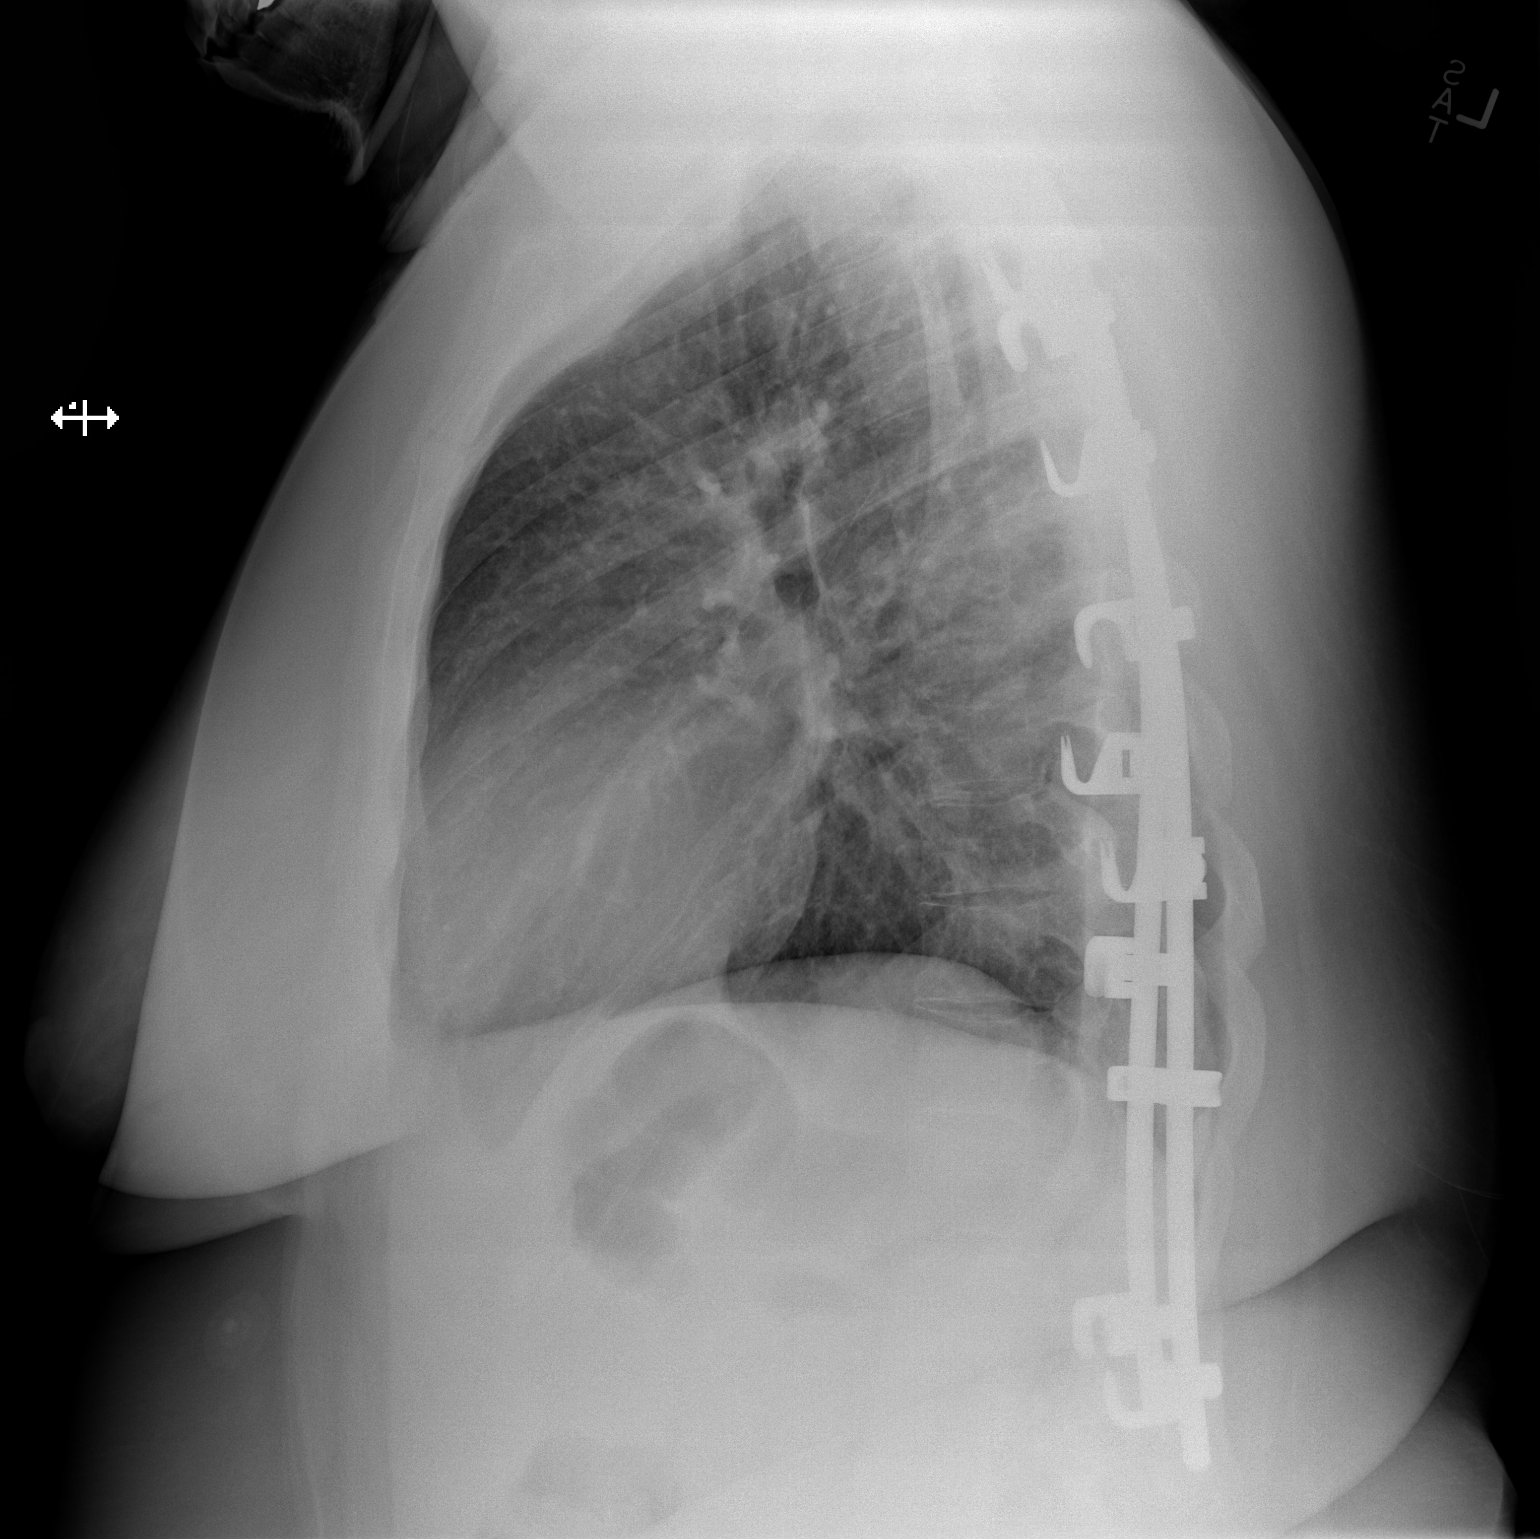

[2 of 2 positions shown; findings below may reference images not displayed]

FINDINGS: There is no focal parenchymal opacity. There is no pleural effusion
or pneumothorax. The heart and mediastinal contours are
unremarkable.

There are to spinal fixation rods transfixing the thoracolumbar
spine.
IMPRESSION: No active cardiopulmonary disease.

## 2019-03-03 LAB — OB RESULTS CONSOLE ANTIBODY SCREEN: Antibody Screen: NEGATIVE

## 2019-03-03 LAB — OB RESULTS CONSOLE HIV ANTIBODY (ROUTINE TESTING): HIV: NONREACTIVE

## 2019-03-03 LAB — OB RESULTS CONSOLE RUBELLA ANTIBODY, IGM: Rubella: IMMUNE

## 2019-03-03 LAB — OB RESULTS CONSOLE HEPATITIS B SURFACE ANTIGEN: Hepatitis B Surface Ag: NEGATIVE

## 2019-03-03 LAB — OB RESULTS CONSOLE GC/CHLAMYDIA
Chlamydia: NEGATIVE
Gonorrhea: NEGATIVE

## 2019-03-03 LAB — OB RESULTS CONSOLE ABO/RH: RH Type: POSITIVE

## 2019-03-03 LAB — OB RESULTS CONSOLE RPR: RPR: NONREACTIVE

## 2019-06-09 NOTE — L&D Delivery Note (Signed)
Delivery Note At 2:28 PM a viable female was delivered via Vaginal, Spontaneous (Presentation: Right Occiput Anterior).  APGAR: 9, 9; weight  .   Placenta status: Spontaneous, Intact.  Cord: 3 vessels with the following complications: None.  Cord pH: not sent  Anesthesia: None Episiotomy: None Lacerations: None Suture Repair: None Est. Blood Loss (mL): 300   It's a girl " Shanelle"!!   Mom to postpartum.  Baby to Couplet care / Skin to Skin.  Ranae Pila 10/02/2019, 3:32 PM

## 2019-09-13 LAB — OB RESULTS CONSOLE GBS: GBS: POSITIVE

## 2019-09-26 ENCOUNTER — Telehealth (HOSPITAL_COMMUNITY): Payer: Self-pay | Admitting: *Deleted

## 2019-09-26 ENCOUNTER — Encounter (HOSPITAL_COMMUNITY): Payer: Self-pay | Admitting: *Deleted

## 2019-09-26 NOTE — Telephone Encounter (Signed)
Preadmission screen  

## 2019-09-27 ENCOUNTER — Telehealth (HOSPITAL_COMMUNITY): Payer: Self-pay | Admitting: *Deleted

## 2019-09-27 NOTE — Telephone Encounter (Signed)
Preadmission screen  

## 2019-10-02 ENCOUNTER — Inpatient Hospital Stay (HOSPITAL_COMMUNITY)
Admission: AD | Admit: 2019-10-02 | Discharge: 2019-10-04 | DRG: 807 | Disposition: A | Discharge status: Home / Self Care | Payer: 59 | Attending: Obstetrics and Gynecology | Admitting: Obstetrics and Gynecology

## 2019-10-02 ENCOUNTER — Other Ambulatory Visit (HOSPITAL_COMMUNITY): Payer: 59

## 2019-10-02 ENCOUNTER — Other Ambulatory Visit: Payer: Self-pay

## 2019-10-02 ENCOUNTER — Encounter (HOSPITAL_COMMUNITY): Payer: Self-pay | Admitting: Obstetrics & Gynecology

## 2019-10-02 DIAGNOSIS — O26893 Other specified pregnancy related conditions, third trimester: Secondary | ICD-10-CM | POA: Diagnosis present

## 2019-10-02 DIAGNOSIS — A6000 Herpesviral infection of urogenital system, unspecified: Secondary | ICD-10-CM | POA: Diagnosis present

## 2019-10-02 DIAGNOSIS — Z20822 Contact with and (suspected) exposure to covid-19: Secondary | ICD-10-CM | POA: Diagnosis present

## 2019-10-02 DIAGNOSIS — M419 Scoliosis, unspecified: Secondary | ICD-10-CM | POA: Diagnosis present

## 2019-10-02 DIAGNOSIS — O99824 Streptococcus B carrier state complicating childbirth: Secondary | ICD-10-CM | POA: Diagnosis present

## 2019-10-02 DIAGNOSIS — Z3A39 39 weeks gestation of pregnancy: Secondary | ICD-10-CM | POA: Diagnosis not present

## 2019-10-02 DIAGNOSIS — O9832 Other infections with a predominantly sexual mode of transmission complicating childbirth: Secondary | ICD-10-CM | POA: Diagnosis present

## 2019-10-02 LAB — TYPE AND SCREEN
ABO/RH(D): O POS
Antibody Screen: NEGATIVE

## 2019-10-02 LAB — CBC
HCT: 35.1 % — ABNORMAL LOW (ref 36.0–46.0)
Hemoglobin: 10.9 g/dL — ABNORMAL LOW (ref 12.0–15.0)
MCH: 27.2 pg (ref 26.0–34.0)
MCHC: 31.1 g/dL (ref 30.0–36.0)
MCV: 87.5 fL (ref 80.0–100.0)
Platelets: 166 10*3/uL (ref 150–400)
RBC: 4.01 MIL/uL (ref 3.87–5.11)
RDW: 14.9 % (ref 11.5–15.5)
WBC: 8.4 10*3/uL (ref 4.0–10.5)
nRBC: 0 % (ref 0.0–0.2)

## 2019-10-02 LAB — SARS CORONAVIRUS 2 (TAT 6-24 HRS): SARS Coronavirus 2: NEGATIVE

## 2019-10-02 MED ORDER — LIDOCAINE HCL (PF) 1 % IJ SOLN
30.0000 mL | INTRAMUSCULAR | Status: DC | PRN
  Filled 2019-10-02: qty 30

## 2019-10-02 MED ORDER — ACETAMINOPHEN 325 MG PO TABS: 650.0000 mg | ORAL_TABLET | ORAL | Status: DC | PRN

## 2019-10-02 MED ORDER — OXYTOCIN 40 UNITS IN NORMAL SALINE INFUSION - SIMPLE MED
2.5000 [IU]/h | INTRAVENOUS | Status: DC
  Filled 2019-10-02: qty 1000

## 2019-10-02 MED ORDER — FLEET ENEMA 7-19 GM/118ML RE ENEM: 1.0000 | ENEMA | RECTAL | Status: DC | PRN

## 2019-10-02 MED ORDER — LACTATED RINGERS IV SOLN: INTRAVENOUS | Status: DC

## 2019-10-02 MED ORDER — DIPHENHYDRAMINE HCL 25 MG PO CAPS: 25.0000 mg | ORAL_CAPSULE | Freq: Four times a day (QID) | ORAL | Status: DC | PRN

## 2019-10-02 MED ORDER — OXYTOCIN BOLUS FROM INFUSION: 500.0000 mL | Freq: Once | INTRAVENOUS | Status: DC

## 2019-10-02 MED ORDER — DIBUCAINE (PERIANAL) 1 % EX OINT: 1.0000 "application " | TOPICAL_OINTMENT | CUTANEOUS | Status: DC | PRN

## 2019-10-02 MED ORDER — OXYCODONE-ACETAMINOPHEN 5-325 MG PO TABS: 2.0000 | ORAL_TABLET | ORAL | Status: DC | PRN

## 2019-10-02 MED ORDER — WITCH HAZEL-GLYCERIN EX PADS: 1.0000 "application " | MEDICATED_PAD | CUTANEOUS | Status: DC | PRN

## 2019-10-02 MED ORDER — OXYCODONE-ACETAMINOPHEN 5-325 MG PO TABS: 1.0000 | ORAL_TABLET | ORAL | Status: DC | PRN

## 2019-10-02 MED ORDER — PENICILLIN G POT IN DEXTROSE 60000 UNIT/ML IV SOLN: 3.0000 10*6.[IU] | INTRAVENOUS | Status: DC

## 2019-10-02 MED ORDER — PRENATAL MULTIVITAMIN CH
1.0000 | ORAL_TABLET | Freq: Every day | ORAL | Status: DC
  Administered 2019-10-03 – 2019-10-04 (×2): 1 via ORAL
  Filled 2019-10-02 (×2): qty 1

## 2019-10-02 MED ORDER — LACTATED RINGERS IV SOLN: 500.0000 mL | INTRAVENOUS | Status: DC | PRN

## 2019-10-02 MED ORDER — ZOLPIDEM TARTRATE 5 MG PO TABS: 5.0000 mg | ORAL_TABLET | Freq: Every evening | ORAL | Status: DC | PRN

## 2019-10-02 MED ORDER — BENZOCAINE-MENTHOL 20-0.5 % EX AERO
1.0000 "application " | INHALATION_SPRAY | CUTANEOUS | Status: DC | PRN
  Filled 2019-10-02 (×2): qty 56

## 2019-10-02 MED ORDER — ACETAMINOPHEN 325 MG PO TABS
650.0000 mg | ORAL_TABLET | ORAL | Status: DC | PRN
  Administered 2019-10-02: 650 mg via ORAL
  Filled 2019-10-02: qty 2

## 2019-10-02 MED ORDER — SIMETHICONE 80 MG PO CHEW: 80.0000 mg | CHEWABLE_TABLET | ORAL | Status: DC | PRN

## 2019-10-02 MED ORDER — OXYCODONE HCL 5 MG PO TABS: 10.0000 mg | ORAL_TABLET | ORAL | Status: DC | PRN

## 2019-10-02 MED ORDER — ONDANSETRON HCL 4 MG PO TABS: 4.0000 mg | ORAL_TABLET | ORAL | Status: DC | PRN

## 2019-10-02 MED ORDER — ONDANSETRON HCL 4 MG/2ML IJ SOLN: 4.0000 mg | INTRAMUSCULAR | Status: DC | PRN

## 2019-10-02 MED ORDER — SENNOSIDES-DOCUSATE SODIUM 8.6-50 MG PO TABS
2.0000 | ORAL_TABLET | ORAL | Status: DC
  Administered 2019-10-03 – 2019-10-04 (×2): 2 via ORAL
  Filled 2019-10-02 (×2): qty 2

## 2019-10-02 MED ORDER — SOD CITRATE-CITRIC ACID 500-334 MG/5ML PO SOLN: 30.0000 mL | ORAL | Status: DC | PRN

## 2019-10-02 MED ORDER — SODIUM CHLORIDE 0.9 % IV SOLN
5.0000 10*6.[IU] | Freq: Once | INTRAVENOUS | Status: AC
  Administered 2019-10-02: 5 10*6.[IU] via INTRAVENOUS
  Filled 2019-10-02: qty 5

## 2019-10-02 MED ORDER — COCONUT OIL OIL
1.0000 "application " | TOPICAL_OIL | Status: DC | PRN
  Administered 2019-10-03: 1 via TOPICAL

## 2019-10-02 MED ORDER — IBUPROFEN 600 MG PO TABS
600.0000 mg | ORAL_TABLET | Freq: Four times a day (QID) | ORAL | Status: DC
  Administered 2019-10-02 – 2019-10-04 (×8): 600 mg via ORAL
  Filled 2019-10-02 (×8): qty 1

## 2019-10-02 MED ORDER — TETANUS-DIPHTH-ACELL PERTUSSIS 5-2.5-18.5 LF-MCG/0.5 IM SUSP: 0.5000 mL | Freq: Once | INTRAMUSCULAR | Status: DC

## 2019-10-02 MED ORDER — OXYCODONE HCL 5 MG PO TABS: 5.0000 mg | ORAL_TABLET | ORAL | Status: DC | PRN

## 2019-10-02 MED ORDER — ONDANSETRON HCL 4 MG/2ML IJ SOLN: 4.0000 mg | Freq: Four times a day (QID) | INTRAMUSCULAR | Status: DC | PRN

## 2019-10-02 NOTE — Plan of Care (Signed)
Jasani Dolney, RN 

## 2019-10-02 NOTE — Progress Notes (Signed)
6/80/-1 AROM clear fluid

## 2019-10-02 NOTE — MAU Note (Signed)
Pt here with reports of contractions since 1am, now every 5-6 mins. She denies LOF. Reports some bloody show. Good fetal movement. Cervix 3cm last week. Scheduled for IOL for ?macrosomia

## 2019-10-02 NOTE — Lactation Note (Signed)
This note was copied from a baby's chart. Lactation Consultation Note  Patient Name: Diamond [name] [id number] Date: 10/02/2019 Reason for consult: Initial assessment;Term P1, 6 hour term female infant. Per mom, she  has a  DEBP at home. 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 Mom is experienced at breastfeeding she breastfed her 34 year old daughter for 6 months, per mom, she did have latch difficulties at first with her 1st child. Mom feels breastfeeding is going well she has no problems or concerns at this time, infant has latched 3 times since birth, 1st 20 minutes, 2nd 030 mintues and  3rd 10 minutes at 1930 pm. Infant was asleep in basinet when LC first arrived in room, LC discussed hand expression with mom and she taught back expressing 3 mls of colostrum in bullet to offer infant at next feeding after latching infant at breast. Mom knows to breastfeed infant acWhen mom was expressing colostrum, infant started stirring and cuing to breastfeed. Mom willing try football position she mainly been using cradle hold, mom latched infant on her left breast, LC ask mom to bring infant to her, wait until infant mouth is wide, infant latched with top lip flanged out, swallows observed. Infant was still breastfeeding after 7 minutes when LC left the room. Per mom, she like this position and she is only feeling a tug with latch no nipple pain. cording to feeding cues, on demand and not exceed 3 hours without feeding infant. Mom knows waking techniques to keep infant awake while breastfeeding. Mom knows to call RN or LC if she has any questions, concerns or needs assistance with latching infant at breast. Reviewed Baby & Me book's Breastfeeding Basics.  LC discussed with mom breastfeeding support within the community after discharge: LC hotline, [facility] outpatient clinic and [facility] online breastfeeding support group.    Maternal Data Formula Feeding for Exclusion: No Has patient been taught Hand  Expression?: Yes Does the patient have breastfeeding experience prior to this delivery?: Yes  Feeding Feeding Type: Breast Fed  LATCH Score Latch: Grasps breast easily, tongue down, lips flanged, rhythmical sucking.  Audible Swallowing: Spontaneous and intermittent  Type of Nipple: Everted at rest and after stimulation  Comfort (Breast/Nipple): Soft / non-tender  Hold (Positioning): Assistance needed to correctly position infant at breast and maintain latch.  LATCH Score: 9  Interventions Interventions: Breast feeding basics reviewed;Breast compression;Adjust position;Assisted with latch;Skin to skin;Support pillows;Position options;Breast massage;Hand express;Expressed milk;DEBP  Lactation Tools Discussed/Used WIC Program: No   Consult Status Consult Status: Follow-up Date: 10/03/19 Follow-up type: In-patient    Danelle Earthly 10/02/2019, 9:14 PM

## 2019-10-02 NOTE — H&P (Addendum)
Diamond [name] is a 34 y.o. female presenting for contractions. Painful contractions q 5 min - 3cm in office. 4cm today in MAU. GBS positive. 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 Also known abnormal First trimester screen c/f T21. However, NIPS done and WNL. S/p MFM consultation and declined invasive testing. Hx scoliosis with harrington rods.  Hx NSVD of 8# infant.  OB History    Gravida  3   Para  1   Term  1   Preterm      AB  1   Living  1     SAB      TAB  1   Ectopic      Multiple      Live Births  1          Past Medical History:  Diagnosis Date  . Chronic hypertension in pregnancy 12/23/2011  . H/O chlamydia infection   . H/O varicella   . Hypertension    no meds  . Obesity   . Other specified antenatal screening(V28.89)    AFP4  . Postpartum care following vaginal delivery (7/17) 12/23/2011  . Scoliosis    Past Surgical History:  Procedure Laterality Date  . SPINAL GROWTH RODS     Family History: family history includes Hypertension in her mother; Stroke in her mother. Social History:  reports that she has never smoked. She has never used smokeless tobacco. She reports that she does not drink alcohol or use drugs.     Maternal Diabetes: No Genetic Screening: Abnormal:  Results: Elevated risk of Trisomy 21  abnormal First trimester screen c/f T21. However, NIPS done and WNL. S/p MFM consultation and declined invasive testing.   Maternal Ultrasounds/Referrals: Normal Fetal Ultrasounds or other Referrals:  None Maternal Substance Abuse:  No Significant Maternal Medications:  None Significant Maternal Lab Results:  Group B Strep positive Other Comments:  None  Review of Systems History Dilation: 4 Effacement (%): 80 Station: -3 Exam by:: F. Morris, RNC Blood pressure 130/75, pulse 89, temperature 98.5 F (36.9 C), temperature source Oral, resp. rate 18, height 5\' 8"  (1.727 m), weight 132.9 kg, last menstrual period 01/01/2019, SpO2 99  %. Exam Physical Exam  NAD, A&O NWOB Abd soft, nondistended, gravid GYN no outbreaks  Prenatal labs: ABO, Rh: O/Positive/-- (09/25 0000) Antibody: Negative (09/25 0000) Rubella: Immune (09/25 0000) RPR: Nonreactive (09/25 0000)  HBsAg: Negative (09/25 0000)  HIV: Non-reactive (09/25 0000)  GBS: Positive/-- (04/07 0000)   Assessment/Plan: 34 yo G3P1011 @ 39.1 wga presenting for contractions. Admit. CLE prn. PCN per protocol for GBS positive. AROM 2 hours after starting PCN.    20 10/02/2019, 9:21 AM

## 2019-10-03 LAB — RPR: RPR Ser Ql: NONREACTIVE

## 2019-10-03 LAB — CBC
HCT: 31.5 % — ABNORMAL LOW (ref 36.0–46.0)
Hemoglobin: 10.2 g/dL — ABNORMAL LOW (ref 12.0–15.0)
MCH: 28.2 pg (ref 26.0–34.0)
MCHC: 32.4 g/dL (ref 30.0–36.0)
MCV: 87 fL (ref 80.0–100.0)
Platelets: 172 10*3/uL (ref 150–400)
RBC: 3.62 MIL/uL — ABNORMAL LOW (ref 3.87–5.11)
RDW: 14.9 % (ref 11.5–15.5)
WBC: 9.4 10*3/uL (ref 4.0–10.5)
nRBC: 0 % (ref 0.0–0.2)

## 2019-10-03 LAB — ABO/RH: ABO/RH(D): O POS

## 2019-10-03 NOTE — Progress Notes (Signed)
CSW received consult for hx of Anxiety and PPA  CSW met with MOB to offer support and complete assessment.    CSW congratulated MOB on the birth of infant. CSW advised MOB of CSW's role and the reason for CSW coming to visit with her. MOB reported that she was diagnosed months after the birth of her daughter, Fredric Mare. MOB reported that she thinks this was around 2013. MOB expressed that she was never on medications or placed in  therapy for her anxiety. MOB reported that she has been feeling fine during this pregnancy and associates her feelings of anxiety at that time with dealing with life changes. MOB reported that she has been feeling happy, bubbly, and great since giving birth to infant. MOB reported that she isn't feeling SI or HI and reports that she isn't involved in DV. MOB reported that she has all needed items to care for infant at this time.   CSW was advised that MOB has support from her family as well.   CSW provided education regarding the baby blues period vs. perinatal mood disorders, discussed treatment and gave resources for mental health follow up if concerns arise.  CSW recommends self-evaluation during the postpartum time period using the New Mom Checklist from Postpartum Progress and encouraged MOB to contact a medical professional if symptoms are noted at any time.   CSW provided review of Sudden Infant Death Syndrome (SIDS) precautions.   CSW identifies no further need for intervention and no barriers to discharge at this time.    Virgie Dad Trigg Delarocha, MSW, LCSW [facility] [phone]

## 2019-10-03 NOTE — Lactation Note (Signed)
This note was copied from a baby's chart. Lactation Consultation Note  Patient Name: Diamond [name] [id number] Date: 10/03/2019 Reason for consult: Follow-up assessment  P2 mother whose infant is now 49 hours old.  This is a term baby at 39+1 weeks.  Mother breast fed her first child (now 34 years old) for 6 months.  Baby was STS asleep on mother's chest when I arrived.  Mother was happy to report that her baby has latched and fed well since birth.  She had some latching difficulty with her first child so this experience is very exciting for her.    Encouraged to feed 8-12 times/24 hours or sooner if baby shows feeding cues.  Mother is familiar with cues and hand expression.  She has been able to express colostrum and will spoon feed any EBM she obtains to baby.  Suggested mother call her RN/LC for latch assistance as needed.    Mother has a [employer] DEBP for home use.  Father and grandmother present.  Mother is looking forward to introducing her baby to her first child.     Maternal Data    Feeding Feeding Type: Breast Fed  LATCH Score                   Interventions    Lactation Tools Discussed/Used     Consult Status Consult Status: Follow-up Date: 10/04/19 Follow-up type: In-patient    Toussaint Golson R Tanaisha Pittman 10/03/2019, 5:00 PM

## 2019-10-03 NOTE — Progress Notes (Signed)
Post Partum Day 1 Subjective: no complaints, up ad lib, voiding and tolerating PO  Objective: Blood pressure 132/76, pulse 60, temperature 98.3 F (36.8 C), temperature source Oral, resp. rate 15, height 5\' 8"  (1.727 m), weight 132.9 kg, last menstrual period 01/01/2019, SpO2 100 %, unknown if currently breastfeeding.  Physical Exam:  General: alert, cooperative, appears stated age and no distress Lochia: appropriate Uterine Fundus: firm Incision: healing well DVT Evaluation: No evidence of DVT seen on physical exam.  Recent Labs    10/02/19 0935 10/03/19 0413  HGB 10.9* 10.2*  HCT 35.1* 31.5*    Assessment/Plan: Plan for discharge tomorrow, Breastfeeding and Lactation consult   LOS: 1 day   10/05/19 10/03/2019, 8:18 AM

## 2019-10-03 NOTE — Plan of Care (Signed)
Progressing

## 2019-10-04 ENCOUNTER — Inpatient Hospital Stay (HOSPITAL_COMMUNITY): Payer: 59

## 2019-10-04 ENCOUNTER — Inpatient Hospital Stay (HOSPITAL_COMMUNITY): Admission: AD | Admit: 2019-10-04 | Payer: 59 | Source: Home / Self Care | Admitting: Obstetrics and Gynecology

## 2019-10-04 MED ORDER — IBUPROFEN 600 MG PO TABS: 600.0000 mg | ORAL_TABLET | Freq: Four times a day (QID) | ORAL | 0 refills | Status: DC | PRN

## 2019-10-04 MED ORDER — ACETAMINOPHEN 325 MG PO TABS: 650.0000 mg | ORAL_TABLET | Freq: Four times a day (QID) | ORAL | 0 refills | Status: DC | PRN

## 2019-10-04 NOTE — Lactation Note (Signed)
This note was copied from a baby's chart. Lactation Consultation Note  Patient Name: Diamond [name] [id number] Date: 10/04/2019 Reason for consult: Follow-up assessment   P2, Baby 44 hours old.  Mother latching baby upon entering w/ intermittent swallows. Stools transitioning to green per mother. Nipples evert and compressible.  Provided pillows for support.  Mother denies or questions.  She has manual pump. Feed on demand with cues.  Goal 8-12+ times per day after first 24 hrs.  Place baby STS if not cueing.  Reviewed engorgement care and monitoring voids/stools.   Maternal Data    Feeding Feeding Type: Breast Fed  LATCH Score Latch: Grasps breast easily, tongue down, lips flanged, rhythmical sucking.  Audible Swallowing: A few with stimulation  Type of Nipple: Everted at rest and after stimulation  Comfort (Breast/Nipple): Soft / non-tender  Hold (Positioning): Assistance needed to correctly position infant at breast and maintain latch.  LATCH Score: 8  Interventions Interventions: Breast feeding basics reviewed;Assisted with latch;Hand pump  Lactation Tools Discussed/Used     Consult Status Consult Status: Complete Date: 10/04/19    Dahlia Byes [facility] 10/04/2019, 11:07 AM

## 2019-10-04 NOTE — Discharge Summary (Signed)
Obstetric Discharge Summary Reason for Admission: onset of labor Prenatal Procedures: none Intrapartum Procedures: spontaneous vaginal delivery Postpartum Procedures: none Complications-Operative and Postpartum: none Hemoglobin  Date Value Ref Range Status  10/03/2019 10.2 (L) 12.0 - 15.0 g/dL Final   HCT  Date Value Ref Range Status  10/03/2019 31.5 (L) 36.0 - 46.0 % Final    Physical Exam:  General: alert, cooperative and no distress Lochia: appropriate Uterine Fundus: firm Incision: healing well DVT Evaluation: No evidence of DVT seen on physical exam.  Discharge Diagnoses: Term Pregnancy-delivered  Discharge Information: Date: 10/04/2019 Activity: pelvic rest Diet: routine Medications: PNV and Ibuprofen Condition: stable Instructions: refer to practice specific booklet Discharge to: home   Newborn Data: Live born female  Birth Weight: 8 lb 2.9 oz (3710 g) APGAR: 9, 9  Newborn Delivery   Birth date/time: 10/02/2019 14:28:00 Delivery type: Vaginal, Spontaneous      Home with mother.  Roselle Locus II 10/04/2019, 7:30 AM

## 2022-01-19 ENCOUNTER — Ambulatory Visit: Payer: 59 | Admitting: Family Medicine

## 2024-05-29 ENCOUNTER — Encounter: Payer: Self-pay | Admitting: Physician Assistant

## 2024-06-21 NOTE — Progress Notes (Signed)
 "    06/22/2024 Diamond [name] [id number] 1986/01/06  Referring provider: Suzie Rosaline PEDLAR, NP Primary GI doctor: Dr. San  ASSESSMENT AND PLAN:  Dyspepsia with bloating, burping, burning in mouth/throat  denies dysphagia, worse with fatty foods/spicy  Started on omeprazole  40 mg and pepcid at night (increased from 20 mg) on December 8 05/15/2024 labs reviewed unremarkable LFTs, WBC, platelets, did have anemia - continue PPI/pepcid - maximize bowel regimen -Lifestyle changes discussed, avoid NSAIDS, ETOH, hand out given to the patient - check H pylori stool with diatherix here in the office -consider EGD, declines at this time - consider RUQ US  and HIDA pending results  Constipation with some rectal ache at times, denies hematochezia Negative FOBT on exam, stool on exam - Increase fiber/ water intake, decrease caffeine, increase activity level. -Will add on Miralax daily and Benefiber, consider linzess -possible component of pelvis floor dysfunction with history and symptoms. Consider pelvic floor dysfunction  Normocytic anemia 05/15/2024 Hgb 11.1, HCT 34.2 MCV 83 Recheck iron, ferritin Likely from menses, FOBT negative in the office reassuring  Patient Care Team: Patient, No Pcp Per as PCP - General (General Practice)  HISTORY OF PRESENT ILLNESS: 39 y.o. female with a past medical history listed below presents for evaluation of GERD.   Discussed the use of AI scribe software for clinical note transcription with the patient, who gave verbal consent to proceed.  History of Present Illness   Diamond [name] is a 39 year old female with iron deficiency anemia who presents for evaluation of upper gastrointestinal symptoms.  Upper gastrointestinal symptoms began in February 2025 with sudden onset bloating and severe gas, which resolved promptly after starting omeprazole . She remained asymptomatic until April 23, 2024, when she developed a sudden pulsating, flipping, or  kicking sensation in the upper abdomen after dinner. This sensation persisted intermittently and was followed by increased gassiness, particularly frequent burping after eating.  After self-initiating omeprazole , symptoms evolved from the pulsating sensation to a burning sensation in the stomach. On May 15, 2024, her primary care increased omeprazole  to 40 mg daily, but the burning persisted. She subsequently developed a new tingling and burning sensation in the throat, sometimes involving the tongue, without dysphagia or odynophagia. Throat and stomach burning often occur together. She added famotidine at night with partial relief.  Dietary modifications include eliminating sodas, reducing spicy, fatty, and processed foods, and increasing chicken and rice intake. Symptoms worsen with over-seasoned, spicy, or fatty foods and improve with dietary changes and increased activity. Alcohol consumption ceased at symptom onset. Symptoms are worse with sedentary behavior.  Constipation is present, with harder stools and increased difficulty with defecation, sometimes requiring Miralax about once a week. Bowel movements are daily but occasionally more difficult, with improvement noted with increased fiber. Occasional anal or rectal aching occurs, particularly with constipation or after processed foods, improving with dietary changes. No blood in stool. Anal pain is described as an ache, without burning or itching.  Unintentional weight loss from 270 lbs in December 2025 to 261 lbs currently is attributed to decreased appetite and reduced food intake. No use of weight loss medications. Increased energy is noted since weight loss, though she occasionally feels winded and experiences frequent leg cramping and restless legs, which she associates with iron deficiency.  Iron deficiency anemia has been present for approximately three years, since her last pregnancy. She has previously been prescribed iron  supplements but is not currently taking them. Menstrual periods are regular and not heavy. Some  urinary urgency is present since childbirth, with decreased ability to hold urine for prolonged periods.  Family history is notable for iron deficiency anemia in her sister and gastrointestinal symptoms in her aunt. No family history of gastrointestinal malignancy or gallbladder disease. No new medications in the past year except for a single dose of a collagen supplement in February 2025, which was discontinued after GI symptoms.  She describes stress and emotional challenges related to single parenting, with improvement in well-being through therapy and lifestyle chang es.        She  reports that she has never smoked. She has never used smokeless tobacco. She reports that she does not drink alcohol and does not use drugs.  RELEVANT GI HISTORY, IMAGING AND LABS: Results   Digital rectal examination Anal region with anterior hemorrhoidal skin tag. No active hemorrhoids, fissures, or masses. Brown stool present in rectal vault. No blood detected on stool testing. Fair amount of retained stool noted.      CBC    Component Value Date/Time   WBC 9.4 10/03/2019 0413   RBC 3.62 (L) 10/03/2019 0413   HGB 10.2 (L) 10/03/2019 0413   HCT 31.5 (L) 10/03/2019 0413   PLT 172 10/03/2019 0413   MCV 87.0 10/03/2019 0413   MCH 28.2 10/03/2019 0413   MCHC 32.4 10/03/2019 0413   RDW 14.9 10/03/2019 0413   No results for input(s): HGB in the last 8760 hours.  CMP     Component Value Date/Time   NA 138 11/23/2014 1433   K 3.7 11/23/2014 1433   CL 106 11/23/2014 1433   CO2 24 11/23/2014 1433   GLUCOSE 104 (H) 11/23/2014 1433   BUN 10 11/23/2014 1433   CREATININE 0.80 11/23/2014 1433   CALCIUM 9.2 11/23/2014 1433   GFRNONAA >60 11/23/2014 1433   GFRAA >60 11/23/2014 1433       No data to display            Current Medications:   Current Outpatient Medications (Other):    famotidine  (PEPCID) 20 MG tablet, Take 20 mg by mouth at bedtime.   omeprazole  (PRILOSEC) 20 MG capsule, Take 20 mg by mouth every morning.  Medical History:  Past Medical History:  Diagnosis Date   Chronic hypertension in pregnancy 12/23/2011   H/O chlamydia infection    H/O varicella    Hypertension    no meds   Obesity    Other specified antenatal screening(V28.89)    AFP4   Postpartum care following vaginal delivery (7/17) 12/23/2011   Scoliosis    Allergies: Allergies[1]   Surgical History:  She  has a past surgical history that includes Spinal growth rods and Wisdom tooth extraction. Family History:  Her family history includes Hypertension in her mother; Stroke in her mother.  REVIEW OF SYSTEMS  : All other systems reviewed and negative except where noted in the History of Present Illness.  PHYSICAL EXAM: BP 118/72   Pulse 65   Ht 5' 8 (1.727 m)   Wt 262 lb 4 oz (119 kg)   BMI 39.87 kg/m  Physical Exam   MEASUREMENTS: Weight- 261. GENERAL APPEARANCE: Well nourished, in no apparent distress. HEENT: No cervical lymphadenopathy, unremarkable thyroid, sclerae anicteric, conjunctiva pink. RESPIRATORY: Respiratory effort normal, breath sounds equal bilaterally without rales, rhonchi, or wheezing. CARDIO: Regular rate and rhythm with no murmurs, rubs, or gallops, peripheral pulses intact. ABDOMEN: Soft, non-distended, active bowel sounds in all four quadrants, no tenderness to palpation, no rebound, no  mass appreciated. RECTAL: No hemorrhoids or fissures, small anterior hemorrhoidal skin tag noted. No rectal masses or abnormalities. Brown stool, hemoccult negative. MUSCULOSKELETAL: Full range of motion, normal gait, without edema. SKIN: Dry, intact without rashes or lesions. No jaundice. NEURO: Alert, oriented, no focal deficits. PSYCH: Cooperative, normal mood and affect.      Alan JONELLE Coombs, PA-C 11:33 AM      [1] No Known Allergies  "

## 2024-06-22 ENCOUNTER — Other Ambulatory Visit

## 2024-06-22 ENCOUNTER — Ambulatory Visit: Payer: Self-pay | Admitting: Physician Assistant

## 2024-06-22 ENCOUNTER — Ambulatory Visit: Admitting: Physician Assistant

## 2024-06-22 ENCOUNTER — Encounter: Payer: Self-pay | Admitting: Physician Assistant

## 2024-06-22 VITALS — BP 118/72 | HR 65 | Ht 68.0 in | Wt 262.2 lb

## 2024-06-22 DIAGNOSIS — K6289 Other specified diseases of anus and rectum: Secondary | ICD-10-CM

## 2024-06-22 DIAGNOSIS — D649 Anemia, unspecified: Secondary | ICD-10-CM | POA: Diagnosis not present

## 2024-06-22 DIAGNOSIS — K5900 Constipation, unspecified: Secondary | ICD-10-CM

## 2024-06-22 DIAGNOSIS — R1013 Epigastric pain: Secondary | ICD-10-CM

## 2024-06-22 DIAGNOSIS — R140 Abdominal distension (gaseous): Secondary | ICD-10-CM

## 2024-06-22 LAB — CBC WITH DIFFERENTIAL/PLATELET
Basophils Absolute: 0 K/uL (ref 0.0–0.1)
Basophils Relative: 0.2 % (ref 0.0–3.0)
Eosinophils Absolute: 0.1 K/uL (ref 0.0–0.7)
Eosinophils Relative: 1.3 % (ref 0.0–5.0)
HCT: 36.5 % (ref 36.0–46.0)
Hemoglobin: 11.9 g/dL — ABNORMAL LOW (ref 12.0–15.0)
Lymphocytes Relative: 37.3 % (ref 12.0–46.0)
Lymphs Abs: 1.6 K/uL (ref 0.7–4.0)
MCHC: 32.5 g/dL (ref 30.0–36.0)
MCV: 83.5 fl (ref 78.0–100.0)
Monocytes Absolute: 0.3 K/uL (ref 0.1–1.0)
Monocytes Relative: 7.3 % (ref 3.0–12.0)
Neutro Abs: 2.3 K/uL (ref 1.4–7.7)
Neutrophils Relative %: 53.9 % (ref 43.0–77.0)
Platelets: 203 K/uL (ref 150.0–400.0)
RBC: 4.37 Mil/uL (ref 3.87–5.11)
RDW: 14.7 % (ref 11.5–15.5)
WBC: 4.2 K/uL (ref 4.0–10.5)

## 2024-06-22 LAB — COMPREHENSIVE METABOLIC PANEL WITH GFR
ALT: 9 U/L (ref 3–35)
AST: 10 U/L (ref 5–37)
Albumin: 4.5 g/dL (ref 3.5–5.2)
Alkaline Phosphatase: 59 U/L (ref 39–117)
BUN: 8 mg/dL (ref 6–23)
CO2: 28 meq/L (ref 19–32)
Calcium: 9.5 mg/dL (ref 8.4–10.5)
Chloride: 105 meq/L (ref 96–112)
Creatinine, Ser: 0.86 mg/dL (ref 0.40–1.20)
GFR: 85.47 mL/min
Glucose, Bld: 90 mg/dL (ref 70–99)
Potassium: 4.6 meq/L (ref 3.5–5.1)
Sodium: 139 meq/L (ref 135–145)
Total Bilirubin: 0.6 mg/dL (ref 0.2–1.2)
Total Protein: 7.9 g/dL (ref 6.0–8.3)

## 2024-06-22 LAB — IBC + FERRITIN
Ferritin: 15.5 ng/mL (ref 10.0–291.0)
Iron: 40 ug/dL — ABNORMAL LOW (ref 42–145)
Saturation Ratios: 10.6 % — ABNORMAL LOW (ref 20.0–50.0)
TIBC: 376.6 ug/dL (ref 250.0–450.0)
Transferrin: 269 mg/dL (ref 212.0–360.0)

## 2024-06-22 LAB — VITAMIN B12: Vitamin B-12: 405 pg/mL (ref 211–911)

## 2024-06-22 NOTE — Patient Instructions (Addendum)
 Your provider has requested that you go to the basement level for lab work before leaving today. Press B on the elevator. The lab is located at the first door on the left as you exit the elevator.  Helicobacter pylori infection occurs when a type of bacteria called H. pylori infects a person's stomach and causes inflammation.  This bacteria if left untreated can cause ulcers and can increase the chances of stomach cancer.  What are the symptoms? ?Pain or discomfort in the upper belly ?Feeling full after eating a small amount of food ?Not feeling hungry ?Nausea or vomiting ?Dark or black-colored bowel movements ?Feeling more tired than usual  Since this is a bacteria that thrives in a harsh, acid environment, the treatment consist of a medication to decrease the acid in your stomach and different types of antibiotics.  It is VERY important to take the medication as prescribed and finish the course to assure the bacteria is treated.  If you have an issue with the medication prescribed please call our office so we can help.  We will do a eradication test or test to assure that you test negative for the bacteria 6-8 weeks after you complete treatment with a stools sample, breath test or upper endoscopy with biopsy.   Can H. pylori be prevented? H. pylori infection can be passed from one person to another. You may want to get other members of your household checked. Some ways to help keep others safe: ?Avoid touching the saliva, vomit, or bowel movements of anyone who has H. pylori infection. ?Wash your hands with soap and water. This is especially important after going to the bathroom or changing diapers and before cooking or preparing food. ?Only drink water from safe sources. Avoid bathing or swimming in contaminated water.  Call for advice if: ?You vomit and notice blood or something that looks like coffee grounds. ?Your stools have blood in them or are black or tar colored. ?You  start having severe pain in your belly. ?Your belly becomes hard or swollen or hurts if you press on it. ?You start to feel very weak, lightheaded, or like you might pass out.  Toileting tips to help with your constipation - Drink at least 64-80 ounces of water/liquid per day. - Establish a time to try to move your bowels every day.  For many people, this is after a cup of coffee or after a meal such as breakfast. - Sit all of the way back on the toilet keeping your back fairly straight and while sitting up, try to rest the tops of your forearms on your upper thighs.   - Raising your feet with a step stool/squatty potty can be helpful to improve the angle that allows your stool to pass through the rectum. - Relax the rectum feeling it bulge toward the toilet water.  If you feel your rectum raising toward your body, you are contracting rather than relaxing. - Breathe in and slowly exhale. Belly breath by expanding your belly towards your belly button. Keep belly expanded as you gently direct pressure down and back to the anus.  A low pitched GRRR sound can assist with increasing intra-abdominal pressure.  (Can also trying to blow on a pinwheel and make it move, this helps with the same belly breathing) - Repeat 3-4 times. If unsuccessful, contract the pelvic floor to restore normal tone and get off the toilet.  Avoid excessive straining. - To reduce excessive wiping by teaching your anus to normally contract,  place hands on outer aspect of knees and resist knee movement outward.  Hold 5-10 second then place hands just inside of knees and resist inward movement of knees.  Hold 5 seconds.  Repeat a few times each way.  Go to the ER if unable to pass gas, severe AB pain, unable to hold down food, any shortness of breath of chest pain.  Miralax is an osmotic laxative.  It only brings more water into the stool.  This is safe to take daily.  Can take up to 17 gram of miralax twice a day.  Mix with  juice or coffee.  Start 1/2 capful for 3-4 days and reassess your response in 3-4 days.  You can increase and decrease the dose based on your response.  Remember, it can take up to 3-4 days to take effect OR for the effects to wear off.   I often pair this with benefiber in the morning to help assure the stool is not too loose.  FIBER SUPPLEMENT You can do metamucil or fibercon once or twice a day but if this causes gas/bloating please switch to Benefiber or Citracel.  Fiber is good for constipation/diarrhea/irritable bowel syndrome.  It can also help with weight loss and can help lower your bad cholesterol (LDL).  Please do 1 TBSP in the morning in water, coffee, or tea.  It can take up to a month before you can see a difference with your bowel movements.  It is cheapest from [employer], [employer], [employer].      Here some information about pelvic floor dysfunction. This may be contributing to some of your symptoms. We could also refer to pelvic floor physical therapy.   Pelvic Floor Dysfunction, Female Pelvic floor dysfunction (PFD) is a condition that results when the group of muscles and connective tissues that support the organs in the pelvis (pelvic floor muscles) do not work well. These muscles and their connections form a sling that supports the colon and bladder. In women, they also support the uterus. PFD causes pelvic floor muscles to be too weak, too tight, or both. In PFD, muscle movements are not coordinated. This may cause bowel or bladder problems. It may also cause pain. What are the causes? This condition may be caused by an injury to the pelvic area or by a weakening of pelvic muscles. This often results from pregnancy and childbirth or other types of strain. In many cases, the exact cause is not known. What increases the risk? The following factors may make you more likely to develop this condition: Having chronic bladder tissue inflammation (interstitial cystitis). Being an  older person. Being overweight. History of radiation treatment for cancer in the pelvic region. Previous pelvic surgery, such as removal of the uterus (hysterectomy). What are the signs or symptoms? Symptoms of this condition vary and may include: Bladder symptoms, such as: Trouble starting urination and emptying the bladder. Frequent urinary tract infections. Leaking urine when coughing, laughing, or exercising (stress incontinence). Having to pass urine urgently or frequently. Pain when passing urine. Bowel symptoms, such as: Constipation. Urgent or frequent bowel movements. Incomplete bowel movements. Painful bowel movements. Leaking stool or gas. Unexplained genital or rectal pain. Genital or rectal muscle spasms. Low back pain. Other symptoms may include: A heavy, full, or aching feeling in the vagina. A bulge that protrudes into the vagina. Pain during or after sex. How is this diagnosed? This condition may be diagnosed based on: Your symptoms and medical history. A physical exam. During  the exam, your health care provider may check your pelvic muscles for tightness, spasm, pain, or weakness. This may include a rectal exam and a pelvic exam. In some cases, you may have diagnostic tests, such as: Electrical muscle function tests. Urine flow testing. X-ray tests of bowel function. Ultrasound of the pelvic organs. How is this treated? Treatment for this condition depends on the symptoms. Treatment options include: Physical therapy. This may include Kegel exercises to help relax or strengthen the pelvic floor muscles. Biofeedback. This type of therapy provides feedback on how tight your pelvic floor muscles are so that you can learn to control them. Internal or external massage therapy. A treatment that involves electrical stimulation of the pelvic floor muscles to help control pain (transcutaneous electrical nerve stimulation, or TENS). Sound wave therapy (ultrasound) to  reduce muscle spasms. Medicines, such as: Muscle relaxants. Bladder control medicines. Surgery to reconstruct or support pelvic floor muscles may be an option if other treatments do not help. Follow these instructions at home: Activity Do your usual activities as told by your health care provider. Ask your health care provider if you should modify any activities. Do pelvic floor strengthening or relaxing exercises at home as told by your physical therapist. Lifestyle Maintain a healthy weight. Eat foods that are high in fiber, such as beans, whole grains, and fresh fruits and vegetables. Limit foods that are high in fat and processed sugars, such as fried or sweet foods. Manage stress with relaxation techniques such as yoga or meditation. General instructions If you have problems with leakage: Use absorbable pads or wear padded underwear. Wash frequently with mild soap. Keep your genital and anal area as clean and dry as possible. Ask your health care provider if you should try a barrier cream to prevent skin irritation. Take warm baths to relieve pelvic muscle tension or spasms. Take over-the-counter and prescription medicines only as told by your health care provider. Keep all follow-up visits. How is this prevented? The cause of PFD is not always known, but there are a few things you can do to reduce the risk of developing this condition, including: Staying at a healthy weight. Getting regular exercise. Managing stress. Contact a health care provider if: Your symptoms are not improving with home care. You have signs or symptoms of PFD that get worse at home. You develop new signs or symptoms. You have signs of a urinary tract infection, such as: Fever. Chills. Increased urinary frequency. A burning feeling when urinating. You have not had a bowel movement in 3 days (constipation). Summary Pelvic floor dysfunction results when the muscles and connective tissues in your pelvic  floor do not work well. These muscles and their connections form a sling that supports your colon and bladder. In women, they also support the uterus. PFD may be caused by an injury to the pelvic area or by a weakening of pelvic muscles. PFD causes pelvic floor muscles to be too weak, too tight, or a combination of both. Symptoms may vary from person to person. In most cases, PFD can be treated with physical therapies and medicines. Surgery may be an option if other treatments do not help. This information is not intended to replace advice given to you by your health care provider. Make sure you discuss any questions you have with your health care provider. Document Revised: 10/02/2020 Document Reviewed: 10/02/2020 Elsevier Patient Education  2022 [employer].  Due to recent changes in healthcare laws, you may see the results of your  imaging and laboratory studies on MyChart before your provider has had a chance to review them.  We understand that in some cases there may be results that are confusing or concerning to you. Not all laboratory results come back in the same time frame and the provider may be waiting for multiple results in order to interpret others.  Please give us  48 hours in order for your provider to thoroughly review all the results before contacting the office for clarification of your results.    I appreciate the  opportunity to care for you  Thank You   [facility]

## 2024-06-24 LAB — TISSUE TRANSGLUTAMINASE, IGA: (tTG) Ab, IgA: <1 U/mL

## 2024-06-24 LAB — IGA: Immunoglobulin A: 366 mg/dL — ABNORMAL HIGH (ref 47–310)

## 2024-06-26 NOTE — Telephone Encounter (Signed)
 H. pylori Diatherix negative

## 2024-07-07 ENCOUNTER — Encounter: Payer: Self-pay | Admitting: Physician Assistant

## 2024-08-21 ENCOUNTER — Ambulatory Visit: Admitting: Physician Assistant
